# Patient Record
Sex: Male | Born: 1960 | Race: Black or African American | Hispanic: No | Marital: Single | Smoking: Never smoker
Health system: Southern US, Community
[De-identification: ages and names within clinical notes are randomized; demographics above are authoritative.]

## PROBLEM LIST (undated history)

## (undated) DIAGNOSIS — I10 Essential (primary) hypertension: Secondary | ICD-10-CM

## (undated) DIAGNOSIS — E119 Type 2 diabetes mellitus without complications: Secondary | ICD-10-CM

## (undated) HISTORY — PX: TESTICLE TORSION REDUCTION: SHX795

## (undated) NOTE — *Deleted (*Deleted)
[facility] - Clinic Note  05/03/2020     CHIEF COMPLAINT Patient presents for No chief complaint on file.   HISTORY OF PRESENT ILLNESS: Joel Mcdonald is a 66 y.o. male who presents to the clinic today for:     Referring physician: Ileana Ladd, MD [address] [place],  [place] [address]  HISTORICAL INFORMATION:   Selected notes from the MEDICAL RECORD NUMBER Referred by Dr. Nedra Hai:  Ocular Hx- PMH-    CURRENT MEDICATIONS: No current outpatient medications on file. (Ophthalmic Drugs)   No current facility-administered medications for this visit. (Ophthalmic Drugs)   Current Outpatient Medications (Other)  Medication Sig  . atorvastatin (LIPITOR) 40 MG tablet Take 40 mg by mouth every morning.  Marland Kitchen BYDUREON 2 MG PEN   . carvedilol (COREG) 12.5 MG tablet   . dicyclomine (BENTYL) 20 MG tablet Take 1 tablet (20 mg total) by mouth 2 (two) times daily. (Patient not taking: Reported on 09/28/2017)  . Exenatide ER (BYDUREON) 2 MG SRER Inject 2 mg into the skin every Sunday.   Marland Kitchen glimepiride (AMARYL) 4 MG tablet Take 1 tablet (4 mg total) by mouth 2 (two) times daily.  . insulin glargine (LANTUS) 100 UNIT/ML injection Inject 0.2 mLs (20 Units total) into the skin at bedtime.  . insulin lispro (HUMALOG) 100 UNIT/ML injection Inject into the skin as needed. <150 - no insulin 151-200 - Take 6 units 201-250 - Take 8 untis 251-300 - Take 10 units 301-350 - Take 12 units 351-400 - Take 14 units >400 - Take 16 units  . losartan-hydrochlorothiazide (HYZAAR) 100-25 MG tablet   . metFORMIN (GLUCOPHAGE) 1000 MG tablet Take 0.5 tablets (500 mg total) by mouth daily with breakfast.  . ondansetron (ZOFRAN) 4 MG tablet Take 1 tablet (4 mg total) by mouth every 8 (eight) hours as needed for nausea or vomiting. (Patient not taking: Reported on 09/28/2017)  . ONE TOUCH ULTRA TEST test strip    No current facility-administered medications for this visit. (Other)       REVIEW OF SYSTEMS:    ALLERGIES No Known Allergies  PAST MEDICAL HISTORY Past Medical History:  Diagnosis Date  . Diabetes mellitus without complication (HCC)   . Hypertension    Past Surgical History:  Procedure Laterality Date  . TESTICLE TORSION REDUCTION      FAMILY HISTORY Family History  Problem Relation Age of Onset  . Stroke Mother   . Hypertension Father   . Diabetes Mellitus II Sister   . Heart attack Neg Hx     SOCIAL HISTORY Social History   Tobacco Use  . Smoking status: Never Smoker  . Smokeless tobacco: Never Used  Substance Use Topics  . Alcohol use: No  . Drug use: No         OPHTHALMIC EXAM: Not recorded     IMAGING AND PROCEDURES  Imaging and Procedures for 05/03/2020           ASSESSMENT/PLAN:    ICD-10-CM   1. Retinal edema  H35.81 OCT, Retina - OU - Both Eyes    1.  2.  3.  Ophthalmic Meds Ordered this visit:  No orders of the defined types were placed in this encounter.      No follow-ups on file.  There are no Patient Instructions on file for this visit.   Explained the diagnoses, plan, and follow up with the patient and they expressed understanding.  Patient expressed understanding of the  importance of proper follow up care.   This document serves as a record of services personally performed by Karie Chimera, MD, PhD. It was created on their behalf by Glee Arvin. Manson Passey, OA an ophthalmic technician. The creation of this record is the provider's dictation and/or activities during the visit.    Electronically signed by: Glee Arvin. [place]ks, [place] 10.13.2021 12:22 PM   Karie Chimera, M.D., Ph.D. Diseases & Surgery of the Retina and Vitreous [facility]     Abbreviations: M myopia (nearsighted); A astigmatism; H hyperopia (farsighted); P presbyopia; Mrx spectacle prescription;  CTL contact lenses; OD right eye; OS left eye; OU both eyes  XT exotropia; ET esotropia; PEK punctate  epithelial keratitis; PEE punctate epithelial erosions; DES dry eye syndrome; MGD meibomian gland dysfunction; ATs artificial tears; PFAT's preservative free artificial tears; NSC nuclear sclerotic cataract; PSC posterior subcapsular cataract; ERM epi-retinal membrane; PVD posterior vitreous detachment; RD retinal detachment; DM diabetes mellitus; DR diabetic retinopathy; NPDR non-proliferative diabetic retinopathy; PDR proliferative diabetic retinopathy; CSME clinically significant macular edema; DME diabetic macular edema; dbh dot blot hemorrhages; CWS cotton wool spot; POAG primary open angle glaucoma; C/D cup-to-disc ratio; HVF humphrey visual field; GVF goldmann visual field; OCT optical coherence tomography; IOP intraocular pressure; BRVO Branch retinal vein occlusion; CRVO central retinal vein occlusion; CRAO central retinal artery occlusion; BRAO branch retinal artery occlusion; RT retinal tear; SB scleral buckle; PPV pars plana vitrectomy; VH Vitreous hemorrhage; PRP panretinal laser photocoagulation; IVK intravitreal kenalog; VMT vitreomacular traction; MH Macular hole;  NVD neovascularization of the disc; NVE neovascularization elsewhere; AREDS age related eye disease study; ARMD age related macular degeneration; POAG primary open angle glaucoma; EBMD epithelial/anterior basement membrane dystrophy; ACIOL anterior chamber intraocular lens; IOL intraocular lens; PCIOL posterior chamber intraocular lens; Phaco/IOL phacoemulsification with intraocular lens placement; PRK photorefractive keratectomy; LASIK laser assisted in situ keratomileusis; HTN hypertension; DM diabetes mellitus; COPD chronic obstructive pulmonary disease

---

## 2012-09-02 ENCOUNTER — Encounter (HOSPITAL_COMMUNITY): Payer: Self-pay | Admitting: *Deleted

## 2012-09-02 ENCOUNTER — Emergency Department (HOSPITAL_COMMUNITY): Payer: PRIVATE HEALTH INSURANCE

## 2012-09-02 ENCOUNTER — Emergency Department (HOSPITAL_COMMUNITY)
Admission: EM | Admit: 2012-09-02 | Discharge: 2012-09-02 | Disposition: A | Discharge status: Home / Self Care | Payer: PRIVATE HEALTH INSURANCE | Attending: Emergency Medicine | Admitting: Emergency Medicine

## 2012-09-02 DIAGNOSIS — W010XXA Fall on same level from slipping, tripping and stumbling without subsequent striking against object, initial encounter: Secondary | ICD-10-CM | POA: Insufficient documentation

## 2012-09-02 DIAGNOSIS — E119 Type 2 diabetes mellitus without complications: Secondary | ICD-10-CM | POA: Insufficient documentation

## 2012-09-02 DIAGNOSIS — I10 Essential (primary) hypertension: Secondary | ICD-10-CM | POA: Insufficient documentation

## 2012-09-02 DIAGNOSIS — Y92009 Unspecified place in unspecified non-institutional (private) residence as the place of occurrence of the external cause: Secondary | ICD-10-CM | POA: Insufficient documentation

## 2012-09-02 DIAGNOSIS — S42309A Unspecified fracture of shaft of humerus, unspecified arm, initial encounter for closed fracture: Secondary | ICD-10-CM | POA: Insufficient documentation

## 2012-09-02 DIAGNOSIS — Y9301 Activity, walking, marching and hiking: Secondary | ICD-10-CM | POA: Insufficient documentation

## 2012-09-02 DIAGNOSIS — S42302A Unspecified fracture of shaft of humerus, left arm, initial encounter for closed fracture: Secondary | ICD-10-CM

## 2012-09-02 HISTORY — DX: Type 2 diabetes mellitus without complications: E11.9

## 2012-09-02 HISTORY — DX: Essential (primary) hypertension: I10

## 2012-09-02 MED ORDER — FENTANYL CITRATE 0.05 MG/ML IJ SOLN
50.0000 ug | Freq: Once | INTRAMUSCULAR | Status: AC
  Administered 2012-09-02: 50 ug via INTRAVENOUS
  Filled 2012-09-02: qty 2

## 2012-09-02 MED ORDER — OXYCODONE-ACETAMINOPHEN 5-325 MG PO TABS: 1.0000 | ORAL_TABLET | ORAL | Status: DC | PRN

## 2012-09-02 NOTE — ED Notes (Signed)
Pt states was walking to car to go to work and fell on L arm/shoulder, complaining of L arm pain, deformity noted. Able to feel L hand/fingers.

## 2012-09-02 NOTE — ED Notes (Signed)
Per ems pt was walking outside to get in car to go to work and fell, L arm went under pt, obvious deformity to L arm. R hand IV 20g, 150 mcg Fentanyl given by EMS. BP 153/95, HR 93, 99% RA, RR 16. Pain 3/10.

## 2012-09-02 NOTE — ED Notes (Signed)
Patient transported to X-ray 

## 2012-09-02 NOTE — ED Provider Notes (Signed)
History  This chart was scribed for Joel Racer, MD by Bennett Scrape, ED Scribe. This patient was seen in room WHALF/WHALF and the patient's care was started at 9:13 AM.  CSN: [id number]  Arrival date & time 09/02/12  0904   First MD Initiated Contact with Patient 09/02/12 0913      Chief Complaint  Patient presents with  . Arm Injury    left    Patient is a 52 y.o. male presenting with arm injury. The history is provided by the patient. No language interpreter was used.  Arm Injury Location:  Shoulder Injury: yes   Mechanism of injury: fall   Fall:    Fall occurred:  Standing   Entrapped after fall: no   Shoulder location:  L shoulder Pain details:    Onset quality:  Sudden   Timing:  Constant   Progression:  Unchanged Chronicity:  New Associated symptoms: decreased range of motion   Associated symptoms: no back pain and no neck pain     Melville Engen is a 52 y.o. male brought in by ambulance, who presents to the Emergency Department complaining of sudden onset, non-changing, constant left shoulder pain after a slip and fall in his driveway while walking out to go to work. He states that he landed with his left arm under him and has had decreased ROM in the left extremity since the incident. He denies head or neck trauma. He has a h/o HTN and DM and denies smoking and alcohol use.  Past Medical History  Diagnosis Date  . Hypertension   . Diabetes mellitus without complication     History reviewed. No pertinent past surgical history.  History reviewed. No pertinent family history.  History  Substance Use Topics  . Smoking status: Never Smoker   . Smokeless tobacco: Never Used  . Alcohol Use: No      Review of Systems  HENT: Negative for neck pain.   Musculoskeletal: Negative for back pain.       Positive for left shoulder pain  Skin: Negative for wound.  Neurological: Negative for headaches.  All other systems reviewed and are  negative.    Allergies  Review of patient's allergies indicates not on file.  Home Medications  No current outpatient prescriptions on file.  Triage Vitals: BP 150/96  Pulse 97  Temp(Src) 98.5 F (36.9 C) (Oral)  Resp 16  SpO2 95%  Physical Exam  Nursing note and vitals reviewed. Constitutional: He is oriented to person, place, and time. He appears well-developed and well-nourished. No distress.  HENT:  Head: Normocephalic and atraumatic.  No obvious head trauma  Eyes: Conjunctivae and EOM are normal.  Neck: Neck supple. No tracheal deviation present.  No obvious neck trauma  Cardiovascular: Normal rate and regular rhythm.   No murmur heard. Pulmonary/Chest: Effort normal and breath sounds normal. No respiratory distress.  Musculoskeletal:  Edema and tenderness to the left humeral head, neurovascularly intact distally, no skin tenting  Neurological: He is alert and oriented to person, place, and time.  Skin: Skin is warm and dry.  Psychiatric: He has a normal mood and affect. His behavior is normal.    ED Course  Procedures (including critical care time)  DIAGNOSTIC STUDIES: Oxygen Saturation is 95% on room air, adequate by my interpretation.    COORDINATION OF CARE: 9:21 AM-Discussed treatment plan which includes XR of left arm with pt at bedside and pt agreed to plan.   9:30 AM- Ordered 50 mcg Sublimaze injection  10:20 AM-Consult complete with Dr. Jerl Santos, orthopedist. Patient case explained and discussed. Dr. Jerl Santos recommends a long arm splint and will f/u with the pt in office in 2 days. Call ended at 10:21 AM.  10:25 AM- Pt informed of radiology results and plan to discharge with f/u instructions with ortho. Pt is agreeable to this plan.  Labs Reviewed - No data to display Dg Humerus Left  09/02/2012  *RADIOLOGY REPORT*  Clinical Data: Fall, pain.  LEFT HUMERUS - 2+ VIEW  Comparison: None.  Findings: The patient has a transverse fracture through the mid  diaphysis of the left humerus.  The fracture demonstrates one shaft width medial displacement and medial angulation of approximately 30 degrees.  No other acute bony abnormality is identified.  IMPRESSION: Transverse fracture mid diaphysis left humerus as above.   Original Report Authenticated By: Holley Dexter, M.D.      1. Fracture of shaft of left humerus       MDM  I personally performed the services described in this documentation, which was scribed in my presence. The recorded information has been reviewed and is accurate.    Joel Racer, MD 09/02/12 [phone]

## 2012-11-02 ENCOUNTER — Ambulatory Visit
Admission: RE | Admit: 2012-11-02 | Discharge: 2012-11-02 | Disposition: A | Discharge status: Home / Self Care | Payer: PRIVATE HEALTH INSURANCE | Source: Ambulatory Visit | Attending: Physical Medicine and Rehabilitation | Admitting: Physical Medicine and Rehabilitation

## 2012-11-02 ENCOUNTER — Other Ambulatory Visit: Payer: Self-pay | Admitting: Physical Medicine and Rehabilitation

## 2012-11-02 DIAGNOSIS — R609 Edema, unspecified: Secondary | ICD-10-CM

## 2014-10-09 ENCOUNTER — Encounter (HOSPITAL_COMMUNITY): Payer: Self-pay

## 2014-10-09 ENCOUNTER — Inpatient Hospital Stay (HOSPITAL_COMMUNITY): Payer: Commercial Managed Care - PPO

## 2014-10-09 ENCOUNTER — Inpatient Hospital Stay (HOSPITAL_COMMUNITY)
Admission: EM | Admit: 2014-10-09 | Discharge: 2014-10-10 | DRG: 392 | Disposition: A | Discharge status: Home / Self Care | Payer: Commercial Managed Care - PPO | Attending: Internal Medicine | Admitting: Internal Medicine

## 2014-10-09 ENCOUNTER — Emergency Department (HOSPITAL_COMMUNITY): Payer: Commercial Managed Care - PPO

## 2014-10-09 ENCOUNTER — Other Ambulatory Visit (HOSPITAL_COMMUNITY): Payer: Self-pay

## 2014-10-09 DIAGNOSIS — R066 Hiccough: Secondary | ICD-10-CM | POA: Diagnosis present

## 2014-10-09 DIAGNOSIS — E669 Obesity, unspecified: Secondary | ICD-10-CM | POA: Diagnosis present

## 2014-10-09 DIAGNOSIS — E119 Type 2 diabetes mellitus without complications: Secondary | ICD-10-CM | POA: Diagnosis present

## 2014-10-09 DIAGNOSIS — R079 Chest pain, unspecified: Secondary | ICD-10-CM | POA: Diagnosis present

## 2014-10-09 DIAGNOSIS — Z794 Long term (current) use of insulin: Secondary | ICD-10-CM

## 2014-10-09 DIAGNOSIS — E1122 Type 2 diabetes mellitus with diabetic chronic kidney disease: Secondary | ICD-10-CM

## 2014-10-09 DIAGNOSIS — R109 Unspecified abdominal pain: Secondary | ICD-10-CM

## 2014-10-09 DIAGNOSIS — I10 Essential (primary) hypertension: Secondary | ICD-10-CM | POA: Diagnosis present

## 2014-10-09 DIAGNOSIS — N179 Acute kidney failure, unspecified: Secondary | ICD-10-CM | POA: Diagnosis not present

## 2014-10-09 DIAGNOSIS — E1165 Type 2 diabetes mellitus with hyperglycemia: Secondary | ICD-10-CM

## 2014-10-09 DIAGNOSIS — R000 Tachycardia, unspecified: Secondary | ICD-10-CM | POA: Diagnosis present

## 2014-10-09 DIAGNOSIS — R112 Nausea with vomiting, unspecified: Principal | ICD-10-CM | POA: Diagnosis present

## 2014-10-09 DIAGNOSIS — N183 Chronic kidney disease, stage 3 (moderate): Secondary | ICD-10-CM

## 2014-10-09 DIAGNOSIS — Z6832 Body mass index (BMI) 32.0-32.9, adult: Secondary | ICD-10-CM | POA: Diagnosis not present

## 2014-10-09 DIAGNOSIS — IMO0002 Reserved for concepts with insufficient information to code with codable children: Secondary | ICD-10-CM

## 2014-10-09 DIAGNOSIS — Z79899 Other long term (current) drug therapy: Secondary | ICD-10-CM | POA: Diagnosis not present

## 2014-10-09 LAB — BASIC METABOLIC PANEL
Anion gap: 11 (ref 5–15)
BUN: 22 mg/dL (ref 6–23)
CHLORIDE: 97 mmol/L (ref 96–112)
CO2: 27 mmol/L (ref 19–32)
Calcium: 9.7 mg/dL (ref 8.4–10.5)
Creatinine, Ser: 1.74 mg/dL — ABNORMAL HIGH (ref 0.50–1.35)
GFR calc Af Amer: 50 mL/min — ABNORMAL LOW (ref >90)
GFR calc non Af Amer: 43 mL/min — ABNORMAL LOW (ref >90)
GLUCOSE: 145 mg/dL — AB (ref 70–99)
POTASSIUM: 4.3 mmol/L (ref 3.5–5.1)
SODIUM: 135 mmol/L (ref 135–145)

## 2014-10-09 LAB — CBC
HCT: 41 % (ref 39.0–52.0)
HEMOGLOBIN: 14 g/dL (ref 13.0–17.0)
MCH: 28.1 pg (ref 26.0–34.0)
MCHC: 34.1 g/dL (ref 30.0–36.0)
MCV: 82.2 fL (ref 78.0–100.0)
Platelets: 272 10*3/uL (ref 150–400)
RBC: 4.99 MIL/uL (ref 4.22–5.81)
RDW: 13.3 % (ref 11.5–15.5)
WBC: 12.2 10*3/uL — ABNORMAL HIGH (ref 4.0–10.5)

## 2014-10-09 LAB — TSH: TSH: 1.21 u[IU]/mL (ref 0.350–4.500)

## 2014-10-09 LAB — TROPONIN I

## 2014-10-09 LAB — D-DIMER, QUANTITATIVE: D-Dimer, Quant: 0.41 ug/mL-FEU (ref 0.00–0.48)

## 2014-10-09 LAB — GLUCOSE, CAPILLARY: GLUCOSE-CAPILLARY: 91 mg/dL (ref 70–99)

## 2014-10-09 LAB — I-STAT TROPONIN, ED: TROPONIN I, POC: 0 ng/mL (ref 0.00–0.08)

## 2014-10-09 MED ORDER — INSULIN GLARGINE 100 UNIT/ML ~~LOC~~ SOLN
40.0000 [IU] | Freq: Every morning | SUBCUTANEOUS | Status: DC
  Filled 2014-10-09: qty 0.4

## 2014-10-09 MED ORDER — ASPIRIN 81 MG PO CHEW
324.0000 mg | CHEWABLE_TABLET | Freq: Once | ORAL | Status: AC
  Administered 2014-10-09: 324 mg via ORAL
  Filled 2014-10-09: qty 4

## 2014-10-09 MED ORDER — ACETAMINOPHEN 325 MG PO TABS: 650.0000 mg | ORAL_TABLET | Freq: Four times a day (QID) | ORAL | Status: DC | PRN

## 2014-10-09 MED ORDER — ACETAMINOPHEN 650 MG RE SUPP: 650.0000 mg | Freq: Four times a day (QID) | RECTAL | Status: DC | PRN

## 2014-10-09 MED ORDER — PANTOPRAZOLE SODIUM 40 MG IV SOLR
40.0000 mg | Freq: Two times a day (BID) | INTRAVENOUS | Status: DC
  Administered 2014-10-09 – 2014-10-10 (×2): 40 mg via INTRAVENOUS
  Filled 2014-10-09 (×3): qty 40

## 2014-10-09 MED ORDER — LABETALOL HCL 5 MG/ML IV SOLN
5.0000 mg | INTRAVENOUS | Status: DC | PRN
  Filled 2014-10-09: qty 4

## 2014-10-09 MED ORDER — ONDANSETRON HCL 4 MG PO TABS: 4.0000 mg | ORAL_TABLET | Freq: Four times a day (QID) | ORAL | Status: DC | PRN

## 2014-10-09 MED ORDER — ONDANSETRON HCL 4 MG/2ML IJ SOLN: 4.0000 mg | Freq: Four times a day (QID) | INTRAMUSCULAR | Status: DC | PRN

## 2014-10-09 MED ORDER — INSULIN ASPART 100 UNIT/ML ~~LOC~~ SOLN
0.0000 [IU] | Freq: Three times a day (TID) | SUBCUTANEOUS | Status: DC
  Administered 2014-10-10: 1 [IU] via SUBCUTANEOUS
  Administered 2014-10-10: 2 [IU] via SUBCUTANEOUS

## 2014-10-09 MED ORDER — SODIUM CHLORIDE 0.9 % IV SOLN
INTRAVENOUS | Status: DC
  Administered 2014-10-09: 23:00:00 via INTRAVENOUS

## 2014-10-09 MED ORDER — SODIUM CHLORIDE 0.9 % IV BOLUS (SEPSIS)
1000.0000 mL | Freq: Once | INTRAVENOUS | Status: AC
Start: 2014-10-09 — End: 2014-10-09
  Administered 2014-10-09: 1000 mL via INTRAVENOUS

## 2014-10-09 MED ORDER — GLIMEPIRIDE 4 MG PO TABS
4.0000 mg | ORAL_TABLET | Freq: Two times a day (BID) | ORAL | Status: DC
  Administered 2014-10-09 – 2014-10-10 (×2): 4 mg via ORAL
  Filled 2014-10-09 (×3): qty 1

## 2014-10-09 NOTE — ED Notes (Signed)
Pt returned from XR. Bed assignment changed to 5W. Pt made aware

## 2014-10-09 NOTE — ED Notes (Signed)
Pt threw up at work today and went to MD cause of that. Had hiccups and they wouldn't go away. When he got to the MD office, HR was up and BP was up. Pt was sent here. Denies any pain at present. Was having indigestion earlier.

## 2014-10-09 NOTE — H&P (Signed)
[facility] History and Physical  Joel KluverRicky Mcdonald UJW:[id number] DOB: 08/20/1960 DOA: 10/09/2014  Referring physician: ER physician. PCP: Redmond BasemanWONG,Joel PATRICK, MD  Chief Complaint: Nausea vomiting and chest pain.  HPI: Joel KluverRicky Mcdonald is a 54 y.o. male with history of diabetes mellitus2, hypertension presents to the ER because of nausea vomiting and chest pain. Patient states he has been having hiccups over the last 2 months. Today patient's hiccups got worsened with patient having 2 episodes of nausea vomiting. Patient also has benign some burning chest pain over the last couple of days. Denies any fever chills shortness of breath productive cough. Patient initially had some epigastric discomfort which has resolved at this time. In the ER patient was found to be tachycardic. Cardiac markers were negative EKG was sinus sinus tachycardia. D-dimer was negative. Since patient is still tachycardic and nauseous patient is being admitted for further management. Patient denies having been started on any new medication except for exenatide 2 months ago.   Review of Systems: As presented in the history of presenting illness, rest negative.  Past Medical History  Diagnosis Date  . Hypertension   . Diabetes mellitus without complication    Past Surgical History  Procedure Laterality Date  . Testicle torsion reduction     Social History:  reports that he has never smoked. He has never used smokeless tobacco. He reports that he does not drink alcohol or use illicit drugs. Where does patient live home. Can patient participate in ADLs? Yes.  No Known Allergies  Family History:  Family History  Problem Relation Age of Onset  . Diabetes Mellitus II Sister       Prior to Admission medications   Medication Sig Start Date End Date Taking? Authorizing Provider  Exenatide ER 2 MG PEN Inject 2 mg into the skin every 7 (seven) days. Sunday   Yes Historical Provider, MD  glimepiride (AMARYL) 4 MG tablet  Take 4 mg by mouth 2 (two) times daily.   Yes Historical Provider, MD  insulin glargine (LANTUS) 100 UNIT/ML injection Inject 40 Units into the skin every morning.    Yes Historical Provider, MD  insulin glulisine (APIDRA) 100 UNIT/ML injection Inject 15-20 Units into the skin every evening.   Yes Historical Provider, MD  metFORMIN (GLUCOPHAGE) 1000 MG tablet Take 1,000 mg by mouth 2 (two) times daily with a meal.   Yes Historical Provider, MD  testosterone (ANDROGEL) 50 MG/5GM (1%) GEL Place 5 g onto the skin daily.   Yes Historical Provider, MD  VALSARTAN PO Take 1 tablet by mouth every morning.   Yes Historical Provider, MD    Physical Exam: Filed Vitals:   10/09/14 2017 10/09/14 2045 10/09/14 2130 10/09/14 2145  BP: 148/94 127/94 159/92 162/93  Pulse: 118 110 109 115  Temp: 98 F (36.7 C)     TempSrc: Oral     Resp: 16  17 19   Height:      Weight:      SpO2: 100% 99% 100% 100%     General:  Well-developed and nourished.  Eyes: Anicteric no pallor.  ENT: No discharge from the ears eyes nose or mouth.  Neck: No mass felt.  Cardiovascular: S1-S2 heard tachycardic.  Respiratory: No rhonchi or crepitations.  Abdomen: Soft nontender bowel sounds present.  Skin: No rash.  Musculoskeletal: No edema.  Psychiatric: Appears normal.  Neurologic: Alert awake oriented to time place and person. Moves all extremities.  Labs on Admission:  Basic Metabolic Panel:  Recent Labs Lab 10/09/14  1630  NA 135  K 4.3  CL 97  CO2 27  GLUCOSE 145*  BUN 22  CREATININE 1.74*  CALCIUM 9.7   Liver Function Tests: No results for input(s): AST, ALT, ALKPHOS, BILITOT, PROT, ALBUMIN in the last 168 hours. No results for input(s): LIPASE, AMYLASE in the last 168 hours. No results for input(s): AMMONIA in the last 168 hours. CBC:  Recent Labs Lab 10/09/14 1630  WBC 12.2*  HGB 14.0  HCT 41.0  MCV 82.2  PLT 272   Cardiac Enzymes:  Recent Labs Lab 10/09/14 2038  TROPONINI  <0.03    BNP (last 3 results) No results for input(s): BNP in the last 8760 hours.  ProBNP (last 3 results) No results for input(s): PROBNP in the last 8760 hours.  CBG: No results for input(s): GLUCAP in the last 168 hours.  Radiological Exams on Admission: Dg Chest 2 View  10/09/2014   CLINICAL DATA:  Acute onset of cough and sore throat for more than 1 week. Nausea, vomiting, diarrhea and fever. Initial encounter.  EXAM: CHEST  2 VIEW  COMPARISON:  None.  FINDINGS: The lungs are well-aerated and clear. There is no evidence of focal opacification, pleural effusion or pneumothorax.  The heart is normal in size; the mediastinal contour is within normal limits. No acute osseous abnormalities are seen. A metallic density overlying the left side of the neck appears to be outside the patient.  IMPRESSION: No acute cardiopulmonary process seen; no displaced rib fractures identified.   Electronically Signed   By: Roanna Raider M.D.   On: 10/09/2014 19:10    EKG: Independently reviewed. Sinus tachycardia.  Assessment/Plan Principal Problem:   Nausea & vomiting Active Problems:   Chest pain   Hiccoughs   Diabetes mellitus type 2, controlled   Sinus tachycardia   Hypertension   ARF (acute renal failure)   1. Nausea vomiting with chest pain with hiccups - at this time I have ordered KUB and LFTs along with lipase. I have placed patient on IV Protonix and clear liquid diet. We will cycle cardiac markers check 2-D echo. Patient abdomen appears benign. And if symptoms does not get better we will get CT of the chest and abdomen. 2. Sinus tachycardia - check thyroid function test and a urine drug screen. Continue with hydration. Patient does not look septic at this time. 3. Diabetes mellitus type 2 controlled - will hold off exenatide for now. Continue Lantus and Amaryl. Hold metformin due to renal failure. 4. Renal failure probably acute - closely follow intake output and metabolic panel.  Continue hydration. 5. Hypertension - when necessary IV labetalol. Hold losartan due to renal failure for now.   DVT Prophylaxis Lovenox.  Code Status: Full code.  Family Communication: None.  Disposition Plan: Admit to inpatient.    KAKRAKANDY,ARSHAD N. [facility] Pager [phone].  If 7PM-7AM, please contact night-coverage [email or web address] Password [email or web address] 10/09/2014, 10:09 PM

## 2014-10-09 NOTE — ED Notes (Signed)
MD at bedside. 

## 2014-10-09 NOTE — Progress Notes (Signed)
2250 pt received to 5w rm 09 via stretcher, a/o x4, denies pain, level 0/10.

## 2014-10-09 NOTE — ED Provider Notes (Signed)
Angiocath insertion Performed by: Dorna LeitzNickle, Camay Pedigo  Consent: Verbal consent obtained. Risks and benefits: risks, benefits and alternatives were discussed Time out: Immediately prior to procedure a "time out" was called to verify the correct patient, procedure, equipment, support staff and site/side marked as required.  Preparation: Patient was prepped and draped in the usual sterile fashion.  Vein Location: left brachial  Ultrasound Guided  Gauge: 18  Normal blood return and flush without difficulty Patient tolerance: Patient tolerated the procedure well with no immediate complications.    Dorna LeitzAlex Tay Whitwell, MD 10/09/14 2112  Blane OharaJoshua Zavitz, MD 10/10/14 [phone]

## 2014-10-09 NOTE — Progress Notes (Signed)
Received report for RN, awaiting patient's arrival.

## 2014-10-09 NOTE — Progress Notes (Signed)
  Pt admitted to the unit. Pt is stable, alert and oriented per baseline. Oriented to room, staff, and call bell. Educated to call for any assistance. Bed in lowest position, call bell within reach- will continue to monitor. 

## 2014-10-09 NOTE — ED Provider Notes (Signed)
CSN: [id number]     Arrival date & time 10/09/14  1754 History   First MD Initiated Contact with Patient 10/09/14 2003     Chief Complaint  Patient presents with  . Hypertension  . Tachycardia     (Consider location/radiation/quality/duration/timing/severity/associated sxs/prior Treatment) HPI Comments: 54 year old male with history of diabetes, high blood pressure, obesity, nonsmoker presents for recurrent reflux symptoms and hiccups worsening for the past 2 weeks. Patient does say worse after eating spicy foods. Patient seen primary doctors today and sent over for further workup for tachycardia and symptoms. Patient denies a blood clot history, recent surgeries, shortness of breath, active cancer, cardiac history, family history of cardiac. Patient is taking valsartan regularly compliant. No weight gain or thyroid history. Symptoms intermittent.  Patient is a 54 y.o. male presenting with hypertension. The history is provided by the patient.  Hypertension Associated symptoms include chest pain (burning, nonradiating, intermittent anterior chest with food). Pertinent negatives include no abdominal pain, no headaches and no shortness of breath.    Past Medical History  Diagnosis Date  . Hypertension   . Diabetes mellitus without complication    Past Surgical History  Procedure Laterality Date  . Testicle torsion reduction     Family History  Problem Relation Age of Onset  . Diabetes Mellitus II Sister    History  Substance Use Topics  . Smoking status: Never Smoker   . Smokeless tobacco: Never Used  . Alcohol Use: No    Review of Systems  Constitutional: Positive for appetite change. Negative for fever and chills.  HENT: Negative for congestion.   Eyes: Negative for visual disturbance.  Respiratory: Negative for shortness of breath.   Cardiovascular: Positive for chest pain (burning, nonradiating, intermittent anterior chest with food).  Gastrointestinal: Negative for  vomiting and abdominal pain.  Genitourinary: Negative for dysuria and flank pain.  Musculoskeletal: Negative for back pain, neck pain and neck stiffness.  Skin: Negative for rash.  Neurological: Negative for light-headedness and headaches.      Allergies  Review of patient's allergies indicates no known allergies.  Home Medications   Prior to Admission medications   Medication Sig Start Date End Date Taking? Authorizing Provider  Exenatide ER 2 MG PEN Inject 2 mg into the skin every 7 (seven) days. Sunday   Yes Historical Provider, MD  glimepiride (AMARYL) 4 MG tablet Take 4 mg by mouth 2 (two) times daily.   Yes Historical Provider, MD  insulin glargine (LANTUS) 100 UNIT/ML injection Inject 40 Units into the skin every morning.    Yes Historical Provider, MD  insulin glulisine (APIDRA) 100 UNIT/ML injection Inject 15-20 Units into the skin every evening.   Yes Historical Provider, MD  metFORMIN (GLUCOPHAGE) 1000 MG tablet Take 1,000 mg by mouth 2 (two) times daily with a meal.   Yes Historical Provider, MD  testosterone (ANDROGEL) 50 MG/5GM (1%) GEL Place 5 g onto the skin daily.   Yes Historical Provider, MD  VALSARTAN PO Take 1 tablet by mouth every morning.   Yes Historical Provider, MD   BP 162/93 mmHg  Pulse 115  Temp(Src) 98 F (36.7 C) (Oral)  Resp 19  Ht  (1.854 m)  Wt 246 lb (111.585 kg)  BMI 32.46 kg/m2  SpO2 100% Physical Exam  Constitutional: He is oriented to person, place, and time. He appears well-developed and well-nourished.  HENT:  Head: Normocephalic and atraumatic.  Eyes: Conjunctivae are normal. Right eye exhibits no discharge. Left eye exhibits no discharge.  Neck: Normal range of motion. Neck supple. No tracheal deviation present.  Cardiovascular: Regular rhythm.  Tachycardia present.   Pulmonary/Chest: Effort normal and breath sounds normal.  Abdominal: Soft. He exhibits no distension. There is no tenderness. There is no guarding.   Musculoskeletal: He exhibits no edema.  Neurological: He is alert and oriented to person, place, and time.  Skin: Skin is warm. No rash noted.  Psychiatric: He has a normal mood and affect.  Nursing note and vitals reviewed.   ED Course  Procedures (including critical care time) Labs Review Labs Reviewed  CBC - Abnormal; Notable for the following:    WBC 12.2 (*)    All other components within normal limits  BASIC METABOLIC PANEL - Abnormal; Notable for the following:    Glucose, Bld 145 (*)    Creatinine, Ser 1.74 (*)    GFR calc non Af Amer 43 (*)    GFR calc Af Amer 50 (*)    All other components within normal limits  D-DIMER, QUANTITATIVE  TROPONIN I  TSH  URINALYSIS, ROUTINE W REFLEX MICROSCOPIC  URINE RAPID DRUG SCREEN (HOSP PERFORMED)  HEPATIC FUNCTION PANEL  LIPASE, BLOOD  CBC WITH DIFFERENTIAL/PLATELET  T4, FREE  T3, FREE  I-STAT TROPOININ, ED  TYPE AND SCREEN    Imaging Review Dg Chest 2 View  10/09/2014   CLINICAL DATA:  Acute onset of cough and sore throat for more than 1 week. Nausea, vomiting, diarrhea and fever. Initial encounter.  EXAM: CHEST  2 VIEW  COMPARISON:  None.  FINDINGS: The lungs are well-aerated and clear. There is no evidence of focal opacification, pleural effusion or pneumothorax.  The heart is normal in size; the mediastinal contour is within normal limits. No acute osseous abnormalities are seen. A metallic density overlying the left side of the neck appears to be outside the patient.  IMPRESSION: No acute cardiopulmonary process seen; no displaced rib fractures identified.   Electronically Signed   By: Roanna RaiderJeffery  Chang M.D.   On: 10/09/2014 19:10   Dg Abd 1 View  10/09/2014   CLINICAL DATA:  Vomiting.  Abdominal pain.  Duration:  1 day  EXAM: ABDOMEN - 1 VIEW  COMPARISON:  08/03/2013  FINDINGS: Upper normal amount of stool in the colon. The small bowel is primarily gasless. No significant abnormal calcifications observed.  IMPRESSION: 1. The  small bowel is relatively gasless, which can reduced negative predictive value of conventional radiography in assessing the small bowel. Upper normal amount of stool in the colon.   Electronically Signed   By: Gaylyn RongWalter  Liebkemann M.D.   On: 10/09/2014 22:25     EKG Interpretation   Date/Time:  Tuesday October 09 2014 18:22:06 EDT Ventricular Rate:  127 PR Interval:  154 QRS Duration: 84 QT Interval:  312 QTC Calculation: 453 R Axis:   -27 Text Interpretation:  Sinus tachycardia Abnormal ECG Confirmed by Nazar Kuan   MD, Deddrick Saindon ([id number]) on 10/09/2014 8:06:23 PM Also confirmed by Jodi MourningZAVITZ  MD,  Robecca Fulgham ([id number])  on 10/09/2014 9:27:45 PM      MDM   Final diagnoses:  Chest pain, unspecified chest pain type  Non-intractable vomiting with nausea, vomiting of unspecified type  Acute renal failure, unspecified acute renal failure type  Hiccoughs  Chest pain Sinus tachycardia ARF  Well-appearing patient with no significant symptoms currently presents for intermittent reflux symptoms and tachycardia. Patient has no blood clot risk factors however with persistent tachycardia d-dimer ordered as patient low risk. Plan for delta troponin, patient  has 3 cardiac risk factors. Thyroid test sent.  Heart score 3  The patients results and plan were reviewed and discussed.   Any x-rays performed were personally reviewed by myself.   Differential diagnosis were considered with the presenting HPI.  Medications  sodium chloride 0.9 % bolus 1,000 mL (0 mLs Intravenous Stopped 10/09/14 2159)  aspirin chewable tablet 324 mg (324 mg Oral Given 10/09/14 2142)    Filed Vitals:   10/09/14 2017 10/09/14 2045 10/09/14 2130 10/09/14 2145  BP: 148/94 127/94 159/92 162/93  Pulse: 118 110 109 115  Temp: 98 F (36.7 C)     TempSrc: Oral     Resp: Height:      Weight:      SpO2: 100% 99% 100% 100%    Final diagnoses:  Chest pain, unspecified chest pain type  Non-intractable vomiting with nausea,  vomiting of unspecified type  Acute renal failure, unspecified acute renal failure type  Hiccoughs    Admission/ observation were discussed with the admitting physician, patient and/or family and they are comfortable with the plan.    Blane Ohara, MD 10/09/14 2236

## 2014-10-10 DIAGNOSIS — E119 Type 2 diabetes mellitus without complications: Secondary | ICD-10-CM

## 2014-10-10 LAB — URINE MICROSCOPIC-ADD ON

## 2014-10-10 LAB — CBC WITH DIFFERENTIAL/PLATELET
BASOS PCT: 0 % (ref 0–1)
Basophils Absolute: 0 10*3/uL (ref 0.0–0.1)
Basophils Absolute: 0 10*3/uL (ref 0.0–0.1)
Basophils Relative: 0 % (ref 0–1)
EOS PCT: 1 % (ref 0–5)
Eosinophils Absolute: 0.1 10*3/uL (ref 0.0–0.7)
Eosinophils Absolute: 0.2 10*3/uL (ref 0.0–0.7)
Eosinophils Relative: 2 % (ref 0–5)
HCT: 42.5 % (ref 39.0–52.0)
HCT: 39.3 % (ref 39.0–52.0)
HEMOGLOBIN: 13.1 g/dL (ref 13.0–17.0)
Hemoglobin: 14.3 g/dL (ref 13.0–17.0)
LYMPHS PCT: 25 % (ref 12–46)
Lymphocytes Relative: 26 % (ref 12–46)
Lymphs Abs: 2.5 10*3/uL (ref 0.7–4.0)
Lymphs Abs: 2.7 10*3/uL (ref 0.7–4.0)
MCH: 28.6 pg (ref 26.0–34.0)
MCH: 28.1 pg (ref 26.0–34.0)
MCHC: 33.6 g/dL (ref 30.0–36.0)
MCHC: 33.3 g/dL (ref 30.0–36.0)
MCV: 85 fL (ref 78.0–100.0)
MCV: 84.3 fL (ref 78.0–100.0)
MONOS PCT: 10 % (ref 3–12)
Monocytes Absolute: 0.6 10*3/uL (ref 0.1–1.0)
Monocytes Absolute: 1 10*3/uL (ref 0.1–1.0)
Monocytes Relative: 6 % (ref 3–12)
NEUTROS ABS: 6.3 10*3/uL (ref 1.7–7.7)
NEUTROS PCT: 68 % (ref 43–77)
Neutro Abs: 6.8 10*3/uL (ref 1.7–7.7)
Neutrophils Relative %: 62 % (ref 43–77)
Platelets: 238 10*3/uL (ref 150–400)
Platelets: 225 10*3/uL (ref 150–400)
RBC: 5 MIL/uL (ref 4.22–5.81)
RBC: 4.66 MIL/uL (ref 4.22–5.81)
RDW: 13.5 % (ref 11.5–15.5)
RDW: 13.5 % (ref 11.5–15.5)
WBC: 10 10*3/uL (ref 4.0–10.5)
WBC: 10.1 10*3/uL (ref 4.0–10.5)

## 2014-10-10 LAB — GLUCOSE, CAPILLARY
GLUCOSE-CAPILLARY: 126 mg/dL — AB (ref 70–99)
GLUCOSE-CAPILLARY: 156 mg/dL — AB (ref 70–99)
Glucose-Capillary: 77 mg/dL (ref 70–99)

## 2014-10-10 LAB — TYPE AND SCREEN
ABO/RH(D): B POS
Antibody Screen: NEGATIVE

## 2014-10-10 LAB — HEPATIC FUNCTION PANEL
ALT: 22 U/L (ref 0–53)
AST: 26 U/L (ref 0–37)
Albumin: 3.5 g/dL (ref 3.5–5.2)
Alkaline Phosphatase: 71 U/L (ref 39–117)
BILIRUBIN TOTAL: 0.7 mg/dL (ref 0.3–1.2)
Bilirubin, Direct: 0.1 mg/dL (ref 0.0–0.5)
Indirect Bilirubin: 0.6 mg/dL (ref 0.3–0.9)
Total Protein: 7.8 g/dL (ref 6.0–8.3)

## 2014-10-10 LAB — RAPID URINE DRUG SCREEN, HOSP PERFORMED
AMPHETAMINES: NOT DETECTED
Barbiturates: NOT DETECTED
Benzodiazepines: NOT DETECTED
Cocaine: NOT DETECTED
OPIATES: NOT DETECTED
Tetrahydrocannabinol: NOT DETECTED

## 2014-10-10 LAB — COMPREHENSIVE METABOLIC PANEL
ALK PHOS: 60 U/L (ref 39–117)
ALT: 19 U/L (ref 0–53)
AST: 22 U/L (ref 0–37)
Albumin: 3.1 g/dL — ABNORMAL LOW (ref 3.5–5.2)
Anion gap: 4 — ABNORMAL LOW (ref 5–15)
BUN: 22 mg/dL (ref 6–23)
CALCIUM: 8.7 mg/dL (ref 8.4–10.5)
CO2: 29 mmol/L (ref 19–32)
Chloride: 104 mmol/L (ref 96–112)
Creatinine, Ser: 1.64 mg/dL — ABNORMAL HIGH (ref 0.50–1.35)
GFR calc Af Amer: 54 mL/min — ABNORMAL LOW (ref >90)
GFR, EST NON AFRICAN AMERICAN: 46 mL/min — AB (ref >90)
GLUCOSE: 93 mg/dL (ref 70–99)
Potassium: 4.7 mmol/L (ref 3.5–5.1)
SODIUM: 137 mmol/L (ref 135–145)
Total Bilirubin: 0.6 mg/dL (ref 0.3–1.2)
Total Protein: 7.5 g/dL (ref 6.0–8.3)

## 2014-10-10 LAB — URINALYSIS, ROUTINE W REFLEX MICROSCOPIC
Bilirubin Urine: NEGATIVE
Glucose, UA: NEGATIVE mg/dL
Hgb urine dipstick: NEGATIVE
Ketones, ur: NEGATIVE mg/dL
Leukocytes, UA: NEGATIVE
Nitrite: NEGATIVE
Protein, ur: 30 mg/dL — AB
SPECIFIC GRAVITY, URINE: 1.017 (ref 1.005–1.030)
Urobilinogen, UA: 1 mg/dL (ref 0.0–1.0)
pH: 6.5 (ref 5.0–8.0)

## 2014-10-10 LAB — TROPONIN I

## 2014-10-10 LAB — ABO/RH: ABO/RH(D): B POS

## 2014-10-10 LAB — T4, FREE: FREE T4: 1.24 ng/dL (ref 0.80–1.80)

## 2014-10-10 LAB — LIPASE, BLOOD: Lipase: 43 U/L (ref 11–59)

## 2014-10-10 MED ORDER — DEXTROSE-NACL 5-0.45 % IV SOLN
INTRAVENOUS | Status: DC
  Administered 2014-10-10: 09:00:00 via INTRAVENOUS

## 2014-10-10 MED ORDER — PANTOPRAZOLE SODIUM 40 MG PO TBEC: 40.0000 mg | DELAYED_RELEASE_TABLET | Freq: Two times a day (BID) | ORAL | Status: DC

## 2014-10-10 MED ORDER — CARVEDILOL 6.25 MG PO TABS: 6.2500 mg | ORAL_TABLET | Freq: Two times a day (BID) | ORAL | Status: DC

## 2014-10-10 MED ORDER — INSULIN GLARGINE 100 UNIT/ML ~~LOC~~ SOLN
20.0000 [IU] | Freq: Two times a day (BID) | SUBCUTANEOUS | Status: DC
  Filled 2014-10-10: qty 0.2

## 2014-10-10 NOTE — Discharge Instructions (Signed)
Follow with Primary MD Redmond BasemanWONG,FRANCIS PATRICK, MD in 7 days   Get CBC, CMP, 2 view Chest X ray checked  by Primary MD next visit.    Activity: As tolerated with Full fall precautions use walker/cane & assistance as needed   Disposition Home     Diet: Heart Healthy    For Heart failure patients - Check your Weight same time everyday, if you gain over 2 pounds, or you develop in leg swelling, experience more shortness of breath or chest pain, call your Primary MD immediately. Follow Cardiac Low Salt Diet and 1.5 lit/day fluid restriction.   On your next visit with your primary care physician please Get Medicines reviewed and adjusted.   Please request your Prim.MD to go over all Hospital Tests and Procedure/Radiological results at the follow up, please get all Hospital records sent to your Prim MD by signing hospital release before you go home.   If you experience worsening of your admission symptoms, develop shortness of breath, life threatening emergency, suicidal or homicidal thoughts you must seek medical attention immediately by calling 911 or calling your MD immediately  if symptoms less severe.  You Must read complete instructions/literature along with all the possible adverse reactions/side effects for all the Medicines you take and that have been prescribed to you. Take any new Medicines after you have completely understood and accpet all the possible adverse reactions/side effects.   Do not drive, operating heavy machinery, perform activities at heights, swimming or participation in water activities or provide baby sitting services if your were admitted for syncope or siezures until you have seen by Primary MD or a Neurologist and advised to do so again.  Do not drive when taking Pain medications.    Do not take more than prescribed Pain, Sleep and Anxiety Medications  Special Instructions: If you have smoked or chewed Tobacco  in the last 2 yrs please stop smoking, stop any  regular Alcohol  and or any Recreational drug use.  Wear Seat belts while driving.   Please note  You were cared for by a hospitalist during your hospital stay. If you have any questions about your discharge medications or the care you received while you were in the hospital after you are discharged, you can call the unit and asked to speak with the hospitalist on call if the hospitalist that took care of you is not available. Once you are discharged, your primary care physician will handle any further medical issues. Please note that NO REFILLS for any discharge medications will be authorized once you are discharged, as it is imperative that you return to your primary care physician (or establish a relationship with a primary care physician if you do not have one) for your aftercare needs so that they can reassess your need for medications and monitor your lab values.

## 2014-10-10 NOTE — Progress Notes (Signed)
NURSING PROGRESS NOTE  Joel KluverRicky Mcdonald [id number] Discharge Data: 10/10/2014 3:06 PM Attending Provider: Leroy SeaPrashant K Singh, MD BJY:NWGN,FAOZHYQPCP:WONG,FRANCIS Joel HartPATRICK, MD     Joel Kluvericky Mcdonald to be D/C'd Home per MD order.  Discussed with the patient the After Visit Summary and all questions fully answered. All IV's discontinued with no bleeding noted. All belongings returned to patient for patient to take home.   Last Vital Signs:  Blood pressure 126/82, pulse 107, temperature 98.7 F (37.1 C), temperature source Oral, resp. rate 18, height 6\' 1"  (1.854 m), weight 109.952 kg (242 lb 6.4 oz), SpO2 95 %.  Discharge Medication List   Medication List    STOP taking these medications        VALSARTAN PO      TAKE these medications        carvedilol 6.25 MG tablet  Commonly known as:  COREG  Take 1 tablet (6.25 mg total) by mouth 2 (two) times daily with a meal.     Exenatide ER 2 MG Pen  Inject 2 mg into the skin every 7 (seven) days. Sunday     glimepiride 4 MG tablet  Commonly known as:  AMARYL  Take 4 mg by mouth 2 (two) times daily.     insulin glargine 100 UNIT/ML injection  Commonly known as:  LANTUS  Inject 40 Units into the skin every morning.     insulin glulisine 100 UNIT/ML injection  Commonly known as:  APIDRA  Inject 15-20 Units into the skin every evening.     metFORMIN 1000 MG tablet  Commonly known as:  GLUCOPHAGE  Take 1,000 mg by mouth 2 (two) times daily with a meal.     pantoprazole 40 MG tablet  Commonly known as:  PROTONIX  Take 1 tablet (40 mg total) by mouth 2 (two) times daily.     testosterone 50 MG/5GM (1%) Gel  Commonly known as:  ANDROGEL  Place 5 g onto the skin daily.         Joel Parsonsattha Anahid Eskelson, RN

## 2014-10-10 NOTE — Progress Notes (Signed)
Utilization review completed.  

## 2014-10-10 NOTE — Discharge Summary (Signed)
Joel Mcdonald, is a 54 y.o. male  DOB [birth year]  MRN [id number].  Admission date:  10/09/2014  Admitting Physician  Eduard Clos, MD  Discharge Date:  10/10/2014   Primary MD  Redmond Baseman, MD  Recommendations for primary care physician for things to follow:   Must follow with GI in a week. Follow BMP closely   Admission Diagnosis  Hiccoughs [R06.6] Abdominal pain [R10.9] Chest pain, unspecified chest pain type [R07.9] Acute renal failure, unspecified acute renal failure type [N17.9] Non-intractable vomiting with nausea, vomiting of unspecified type [R11.2]   Discharge Diagnosis  Hiccoughs [R06.6] Abdominal pain [R10.9] Chest pain, unspecified chest pain type [R07.9] Acute renal failure, unspecified acute renal failure type [N17.9] Non-intractable vomiting with nausea, vomiting of unspecified type [R11.2]     Principal Problem:   Nausea & vomiting Active Problems:   Chest pain   Hiccoughs   Diabetes mellitus type 2, controlled   Sinus tachycardia   Hypertension   ARF (acute renal failure)      Past Medical History  Diagnosis Date  . Hypertension   . Diabetes mellitus without complication     Past Surgical History  Procedure Laterality Date  . Testicle torsion reduction         History of present illness and  Hospital Course:     Kindly see H&P for history of present illness and admission details, please review complete Labs, Consult reports and Test reports for all details in brief  HPI  from the history and physical done on the day of admission  Joel Mcdonald is a 54 y.o. male with history of diabetes mellitus2, hypertension presents to the ER because of nausea vomiting and chest pain. Patient states he has been having hiccups over the last 2 months. Today patient's hiccups  got worsened with patient having 2 episodes of nausea vomiting. Patient also has benign some burning chest pain over the last couple of days. Denies any fever chills shortness of breath productive cough. Patient initially had some epigastric discomfort which has resolved at this time. In the ER patient was found to be tachycardic. Cardiac markers were negative EKG was sinus sinus tachycardia. D-dimer was negative. Since patient is still tachycardic and nauseous patient is being admitted for further management. Patient denies having been started on any new medication except for exenatide 2 months ago.   Hospital Course    1. Persistent heartburn with ongoing hiccups and GI etiology induced substernal chest pain. Symptoms much better with bowel rest and IV PPI, highly suspicious for esophagitis/gastritis, placed on oral PPI, troponin 2 sets negative, EKG nonacute. We will place on PPI twice a day, requesting to follow with GI in the next 1-2 weeks will likely require EGD in the future.   2. Renal failure. No previous labs in the system could be chronic but not confirmed, ARB discontinued, hydrated gently with some improvement, request PCP to continue monitoring BMP.   3. DM type II. Stable continue home regimen. If renal function continues to decline discontinue  Glucophage.   4. Essential hypertension. Since ARB. We will place on low-dose Coreg.     Discharge Condition: Stable   Follow UP  Follow-up Information    Follow up with Redmond Baseman, MD. Schedule an appointment as soon as possible for a visit in 1 week.   Specialty:  Family Medicine   Contact information:   [address] [place] [address] [phone]       Follow up with Stan Head, MD. Schedule an appointment as soon as possible for a visit in 1 week.   Specialty:  Gastroenterology   Why:  Needs EGD   Contact information:   [address] [place] [address] [phone]         Discharge  Instructions  and  Discharge Medications          Discharge Instructions    Diet - low sodium heart healthy    Complete by:  As directed      Discharge instructions    Complete by:  As directed   Follow with Primary MD Redmond Baseman, MD in 7 days   Get CBC, CMP, 2 view Chest X ray checked  by Primary MD next visit.    Activity: As tolerated with Full fall precautions use walker/cane & assistance as needed   Disposition Home     Diet: Heart Healthy    For Heart failure patients - Check your Weight same time everyday, if you gain over 2 pounds, or you develop in leg swelling, experience more shortness of breath or chest pain, call your Primary MD immediately. Follow Cardiac Low Salt Diet and 1.5 lit/day fluid restriction.   On your next visit with your primary care physician please Get Medicines reviewed and adjusted.   Please request your Prim.MD to go over all Hospital Tests and Procedure/Radiological results at the follow up, please get all Hospital records sent to your Prim MD by signing hospital release before you go home.   If you experience worsening of your admission symptoms, develop shortness of breath, life threatening emergency, suicidal or homicidal thoughts you must seek medical attention immediately by calling 911 or calling your MD immediately  if symptoms less severe.  You Must read complete instructions/literature along with all the possible adverse reactions/side effects for all the Medicines you take and that have been prescribed to you. Take any new Medicines after you have completely understood and accpet all the possible adverse reactions/side effects.   Do not drive, operating heavy machinery, perform activities at heights, swimming or participation in water activities or provide baby sitting services if your were admitted for syncope or siezures until you have seen by Primary MD or a Neurologist and advised to do so again.  Do not drive when taking  Pain medications.    Do not take more than prescribed Pain, Sleep and Anxiety Medications  Special Instructions: If you have smoked or chewed Tobacco  in the last 2 yrs please stop smoking, stop any regular Alcohol  and or any Recreational drug use.  Wear Seat belts while driving.   Please note  You were cared for by a hospitalist during your hospital stay. If you have any questions about your discharge medications or the care you received while you were in the hospital after you are discharged, you can call the unit and asked to speak with the hospitalist on call if the hospitalist that took care of you is not available. Once you are discharged, your primary care  physician will handle any further medical issues. Please note that NO REFILLS for any discharge medications will be authorized once you are discharged, as it is imperative that you return to your primary care physician (or establish a relationship with a primary care physician if you do not have one) for your aftercare needs so that they can reassess your need for medications and monitor your lab values.     Increase activity slowly    Complete by:  As directed             Medication List    STOP taking these medications        VALSARTAN PO      TAKE these medications        carvedilol 6.25 MG tablet  Commonly known as:  COREG  Take 1 tablet (6.25 mg total) by mouth 2 (two) times daily with a meal.     Exenatide ER 2 MG Pen  Inject 2 mg into the skin every 7 (seven) days. Sunday     glimepiride 4 MG tablet  Commonly known as:  AMARYL  Take 4 mg by mouth 2 (two) times daily.     insulin glargine 100 UNIT/ML injection  Commonly known as:  LANTUS  Inject 40 Units into the skin every morning.     insulin glulisine 100 UNIT/ML injection  Commonly known as:  APIDRA  Inject 15-20 Units into the skin every evening.     metFORMIN 1000 MG tablet  Commonly known as:  GLUCOPHAGE  Take 1,000 mg by mouth 2 (two) times  daily with a meal.     pantoprazole 40 MG tablet  Commonly known as:  PROTONIX  Take 1 tablet (40 mg total) by mouth 2 (two) times daily.     testosterone 50 MG/5GM (1%) Gel  Commonly known as:  ANDROGEL  Place 5 g onto the skin daily.          Diet and Activity recommendation: See Discharge Instructions above   Consults obtained - None   Major procedures and Radiology Reports - PLEASE review detailed and final reports for all details, in brief -       Dg Chest 2 View  10/09/2014   CLINICAL DATA:  Acute onset of cough and sore throat for more than 1 week. Nausea, vomiting, diarrhea and fever. Initial encounter.  EXAM: CHEST  2 VIEW  COMPARISON:  None.  FINDINGS: The lungs are well-aerated and clear. There is no evidence of focal opacification, pleural effusion or pneumothorax.  The heart is normal in size; the mediastinal contour is within normal limits. No acute osseous abnormalities are seen. A metallic density overlying the left side of the neck appears to be outside the patient.  IMPRESSION: No acute cardiopulmonary process seen; no displaced rib fractures identified.   Electronically Signed   By: Roanna RaiderJeffery  Chang M.D.   On: 10/09/2014 19:10   Dg Abd 1 View  10/09/2014   CLINICAL DATA:  Vomiting.  Abdominal pain.  Duration:  1 day  EXAM: ABDOMEN - 1 VIEW  COMPARISON:  08/03/2013  FINDINGS: Upper normal amount of stool in the colon. The small bowel is primarily gasless. No significant abnormal calcifications observed.  IMPRESSION: 1. The small bowel is relatively gasless, which can reduced negative predictive value of conventional radiography in assessing the small bowel. Upper normal amount of stool in the colon.   Electronically Signed   By: Gaylyn RongWalter  Liebkemann M.D.   On: 10/09/2014 22:25  Micro Results      No results found for this or any previous visit (from the past 240 hour(s)).     Today   Subjective:   Joel Mcdonald today has no headache,no chest abdominal  pain,no new weakness tingling or numbness, feels much better wants to go home today.    Objective:   Blood pressure 126/82, pulse 107, temperature 98.7 F (37.1 C), temperature source Oral, resp. rate 18, height  (1.854 m), weight 109.952 kg (242 lb 6.4 oz), SpO2 95 %.   Intake/Output Summary (Last 24 hours) at 10/10/14 1408 Last data filed at 10/10/14 1337  Gross per 24 hour  Intake   2387 ml  Output   2000 ml  Net    387 ml    Exam Awake Alert, Oriented x 3, No new F.N deficits, Normal affect [place].AT,PERRAL Supple Neck,No JVD, No cervical lymphadenopathy appriciated.  Symmetrical Chest wall movement, Good air movement bilaterally, CTAB RRR,No Gallops,Rubs or new Murmurs, No Parasternal Heave +ve B.Sounds, Abd Soft, Non tender, No organomegaly appriciated, No rebound -guarding or rigidity. No Cyanosis, Clubbing or edema, No new Rash or bruise  Data Review   CBC w Diff:  Lab Results  Component Value Date   WBC 10.1 10/10/2014   HGB 13.1 10/10/2014   HCT 39.3 10/10/2014   PLT 225 10/10/2014   LYMPHOPCT 26 10/10/2014   MONOPCT 10 10/10/2014   EOSPCT 2 10/10/2014   BASOPCT 0 10/10/2014    CMP:  Lab Results  Component Value Date   NA 137 10/10/2014   K 4.7 10/10/2014   CL 104 10/10/2014   CO2 29 10/10/2014   BUN 22 10/10/2014   CREATININE 1.64* 10/10/2014   PROT 7.5 10/10/2014   ALBUMIN 3.1* 10/10/2014   BILITOT 0.6 10/10/2014   ALKPHOS 60 10/10/2014   AST 22 10/10/2014   ALT 19 10/10/2014  .   Total Time in preparing paper work, data evaluation and todays exam - 35 minutes  Leroy Sea M.D on 10/10/2014 at 2:08 PM  [facility]   Office  [phone]

## 2014-10-11 LAB — T3, FREE: T3, Free: 2.9 pg/mL (ref 2.0–4.4)

## 2014-12-25 ENCOUNTER — Other Ambulatory Visit: Payer: Self-pay | Admitting: Family Medicine

## 2014-12-25 DIAGNOSIS — R7989 Other specified abnormal findings of blood chemistry: Secondary | ICD-10-CM

## 2014-12-25 DIAGNOSIS — N189 Chronic kidney disease, unspecified: Secondary | ICD-10-CM

## 2015-01-22 ENCOUNTER — Ambulatory Visit
Admission: RE | Admit: 2015-01-22 | Discharge: 2015-01-22 | Disposition: A | Discharge status: Home / Self Care | Payer: Commercial Managed Care - PPO | Source: Ambulatory Visit | Attending: Family Medicine | Admitting: Family Medicine

## 2015-01-22 DIAGNOSIS — R7989 Other specified abnormal findings of blood chemistry: Secondary | ICD-10-CM

## 2015-01-22 DIAGNOSIS — N189 Chronic kidney disease, unspecified: Secondary | ICD-10-CM

## 2015-10-24 DIAGNOSIS — E785 Hyperlipidemia, unspecified: Secondary | ICD-10-CM | POA: Insufficient documentation

## 2015-10-24 DIAGNOSIS — I10 Essential (primary) hypertension: Secondary | ICD-10-CM | POA: Insufficient documentation

## 2015-11-16 ENCOUNTER — Emergency Department (HOSPITAL_COMMUNITY)
Admission: EM | Admit: 2015-11-16 | Discharge: 2015-11-16 | Disposition: A | Discharge status: Home / Self Care | Payer: Commercial Managed Care - PPO | Attending: Emergency Medicine | Admitting: Emergency Medicine

## 2015-11-16 ENCOUNTER — Emergency Department (HOSPITAL_COMMUNITY): Payer: Commercial Managed Care - PPO

## 2015-11-16 ENCOUNTER — Encounter (HOSPITAL_COMMUNITY): Payer: Self-pay

## 2015-11-16 DIAGNOSIS — E119 Type 2 diabetes mellitus without complications: Secondary | ICD-10-CM | POA: Diagnosis not present

## 2015-11-16 DIAGNOSIS — Y9389 Activity, other specified: Secondary | ICD-10-CM | POA: Insufficient documentation

## 2015-11-16 DIAGNOSIS — S8992XA Unspecified injury of left lower leg, initial encounter: Secondary | ICD-10-CM | POA: Diagnosis not present

## 2015-11-16 DIAGNOSIS — S01111A Laceration without foreign body of right eyelid and periocular area, initial encounter: Secondary | ICD-10-CM | POA: Insufficient documentation

## 2015-11-16 DIAGNOSIS — Y998 Other external cause status: Secondary | ICD-10-CM | POA: Diagnosis not present

## 2015-11-16 DIAGNOSIS — Z79899 Other long term (current) drug therapy: Secondary | ICD-10-CM | POA: Insufficient documentation

## 2015-11-16 DIAGNOSIS — S0990XA Unspecified injury of head, initial encounter: Secondary | ICD-10-CM | POA: Insufficient documentation

## 2015-11-16 DIAGNOSIS — Z7984 Long term (current) use of oral hypoglycemic drugs: Secondary | ICD-10-CM | POA: Insufficient documentation

## 2015-11-16 DIAGNOSIS — S8991XA Unspecified injury of right lower leg, initial encounter: Secondary | ICD-10-CM | POA: Insufficient documentation

## 2015-11-16 DIAGNOSIS — Z794 Long term (current) use of insulin: Secondary | ICD-10-CM | POA: Diagnosis not present

## 2015-11-16 DIAGNOSIS — Y9289 Other specified places as the place of occurrence of the external cause: Secondary | ICD-10-CM | POA: Insufficient documentation

## 2015-11-16 DIAGNOSIS — R55 Syncope and collapse: Secondary | ICD-10-CM | POA: Insufficient documentation

## 2015-11-16 DIAGNOSIS — W1839XA Other fall on same level, initial encounter: Secondary | ICD-10-CM | POA: Insufficient documentation

## 2015-11-16 DIAGNOSIS — I10 Essential (primary) hypertension: Secondary | ICD-10-CM | POA: Insufficient documentation

## 2015-11-16 LAB — CBC
HCT: 36.8 % — ABNORMAL LOW (ref 39.0–52.0)
HEMOGLOBIN: 12 g/dL — AB (ref 13.0–17.0)
MCH: 27.8 pg (ref 26.0–34.0)
MCHC: 32.6 g/dL (ref 30.0–36.0)
MCV: 85.2 fL (ref 78.0–100.0)
Platelets: 167 10*3/uL (ref 150–400)
RBC: 4.32 MIL/uL (ref 4.22–5.81)
RDW: 13.1 % (ref 11.5–15.5)
WBC: 10.4 10*3/uL (ref 4.0–10.5)

## 2015-11-16 LAB — BASIC METABOLIC PANEL
Anion gap: 10 (ref 5–15)
BUN: 20 mg/dL (ref 6–20)
CALCIUM: 8.6 mg/dL — AB (ref 8.9–10.3)
CO2: 23 mmol/L (ref 22–32)
Chloride: 113 mmol/L — ABNORMAL HIGH (ref 101–111)
Creatinine, Ser: 1.9 mg/dL — ABNORMAL HIGH (ref 0.61–1.24)
GFR calc non Af Amer: 38 mL/min — ABNORMAL LOW (ref >60)
GFR, EST AFRICAN AMERICAN: 45 mL/min — AB (ref >60)
Glucose, Bld: 58 mg/dL — ABNORMAL LOW (ref 65–99)
POTASSIUM: 3.8 mmol/L (ref 3.5–5.1)
Sodium: 146 mmol/L — ABNORMAL HIGH (ref 135–145)

## 2015-11-16 LAB — CBG MONITORING, ED
Glucose-Capillary: 58 mg/dL — ABNORMAL LOW (ref 65–99)
Glucose-Capillary: 154 mg/dL — ABNORMAL HIGH (ref 65–99)

## 2015-11-16 LAB — TROPONIN I

## 2015-11-16 MED ORDER — SODIUM CHLORIDE 0.9 % IV BOLUS (SEPSIS)
1000.0000 mL | Freq: Once | INTRAVENOUS | Status: AC
  Administered 2015-11-16: 1000 mL via INTRAVENOUS

## 2015-11-16 NOTE — ED Provider Notes (Signed)
CSN: [id number]     Arrival date & time 11/16/15  1619 History   First MD Initiated Contact with Patient 11/16/15 1629     Chief Complaint  Patient presents with  . Loss of Consciousness     (Consider location/radiation/quality/duration/timing/severity/associated sxs/prior Treatment) HPI Comments: 55 year old male with history of diabetes, high blood pressure, renal insufficiency presents after multiple syncopal episodes. Patient was going into the [employer] to fix something and passed out falling on the ground. Family helped him up and he became lightheaded again and passed out again. Patient did have head injury and knee injury bilateral. Patient passed out once in the distant past. No classic blood clot risk factors. Patient has no known cardiac disease. No chest pain or shortness of breath today. Patient feels well currently other than knee abrasion and tenderness. No blood in the stools. No valve disease. Decreased by mouth intake since yesterday. Patient felt generally weak before he passed out.  Patient is a 55 y.o. male presenting with syncope. The history is provided by the patient and medical records.  Loss of Consciousness Associated symptoms: weakness (general)   Associated symptoms: no chest pain, no fever, no headaches, no shortness of breath and no vomiting     Past Medical History  Diagnosis Date  . Hypertension   . Diabetes mellitus without complication [facility])    Past Surgical History  Procedure Laterality Date  . Testicle torsion reduction     Family History  Problem Relation Age of Onset  . Diabetes Mellitus II Sister    Social History  Substance Use Topics  . Smoking status: Never Smoker   . Smokeless tobacco: Never Used  . Alcohol Use: No    Review of Systems  Constitutional: Negative for fever and chills.  HENT: Negative for congestion.   Eyes: Negative for visual disturbance.  Respiratory: Negative for shortness of breath.   Cardiovascular: Positive for  syncope. Negative for chest pain.  Gastrointestinal: Negative for vomiting and abdominal pain.  Genitourinary: Negative for dysuria and flank pain.  Musculoskeletal: Negative for back pain, neck pain and neck stiffness.  Skin: Negative for rash.  Neurological: Positive for weakness (general) and light-headedness. Negative for headaches.      Allergies  Review of patient's allergies indicates no known allergies.  Home Medications   Prior to Admission medications   Medication Sig Start Date End Date Taking? Authorizing Provider  atorvastatin (LIPITOR) 40 MG tablet Take 40 mg by mouth every morning.   Yes Historical Provider, MD  carvedilol (COREG) 6.25 MG tablet Take 1 tablet (6.25 mg total) by mouth 2 (two) times daily with a meal. 10/10/14  Yes Leroy Sea, MD  Exenatide ER (BYDUREON) 2 MG SRER Inject 2 mg into the skin every Sunday.  06/28/14  Yes Historical Provider, MD  glimepiride (AMARYL) 4 MG tablet Take 4 mg by mouth 2 (two) times daily.   Yes Historical Provider, MD  insulin lispro (HUMALOG) 100 UNIT/ML injection Inject 76 Units into the skin continuous. 09/05/15  Yes Historical Provider, MD  metFORMIN (GLUCOPHAGE) 1000 MG tablet Take 1,000 mg by mouth daily with breakfast.    Yes Historical Provider, MD  testosterone (ANDROGEL) 50 MG/5GM (1%) GEL Place 5 g onto the skin daily.   Yes Historical Provider, MD  valsartan-hydrochlorothiazide (DIOVAN-HCT) 320-25 MG tablet Take 1 tablet by mouth daily. 08/17/08  Yes Historical Provider, MD   BP 160/100 mmHg  Pulse 85  Temp(Src) 97.6 F (36.4 C) (Oral)  Resp 15  SpO2  98% Physical Exam  Constitutional: He is oriented to person, place, and time. He appears well-developed and well-nourished.  HENT:  Head: Normocephalic.  Dry mm 1 cm superificial lac above right eye brow, no midline vertebral tenderness c/th/lumbar  Eyes: Conjunctivae are normal. Right eye exhibits no discharge. Left eye exhibits no discharge.  Neck: Normal  range of motion. Neck supple. No tracheal deviation present.  Cardiovascular: Normal rate, regular rhythm and intact distal pulses.   No murmur heard. Pulmonary/Chest: Effort normal and breath sounds normal.  Abdominal: Soft. He exhibits no distension. There is no tenderness. There is no guarding.  Musculoskeletal: He exhibits no edema.  Neurological: He is alert and oriented to person, place, and time.  Skin: Skin is warm. No rash noted.  Psychiatric: He has a normal mood and affect.  Nursing note and vitals reviewed.   ED Course  Procedures (including critical care time) Labs Review Labs Reviewed  BASIC METABOLIC PANEL - Abnormal; Notable for the following:    Sodium 146 (*)    Chloride 113 (*)    Glucose, Bld 58 (*)    Creatinine, Ser 1.90 (*)    Calcium 8.6 (*)    GFR calc non Af Amer 38 (*)    GFR calc Af Amer 45 (*)    All other components within normal limits  CBC - Abnormal; Notable for the following:    Hemoglobin 12.0 (*)    HCT 36.8 (*)    All other components within normal limits  CBG MONITORING, ED - Abnormal; Notable for the following:    Glucose-Capillary 58 (*)    All other components within normal limits  TROPONIN I  URINALYSIS, ROUTINE W REFLEX MICROSCOPIC (NOT AT [facility])    Imaging Review Ct Head Wo Contrast  11/16/2015  CLINICAL DATA:  Syncope. Fall. Hit front of head. Questionable loss of consciousness. EXAM: CT HEAD WITHOUT CONTRAST TECHNIQUE: Contiguous axial images were obtained from the base of the skull through the vertex without intravenous contrast. COMPARISON:  None. FINDINGS: No acute cortical infarct, hemorrhage, or mass lesion ispresent. Ventricles are of normal size. No significant extra-axial fluid collection is present. The paranasal sinuses andmastoid air cells are clear. The osseous skull is intact. IMPRESSION: 1. No acute intracranial abnormalities.  Normal brain. Electronically Signed   By: Signa Kell M.D.   On: 11/16/2015 18:01   Dg  Knee Complete 4 Views Left  11/16/2015  CLINICAL DATA:  55 year old male with history of pain and abrasions in the knees bilaterally after falling from a standing position onto a gravel driveway. EXAM: LEFT KNEE - COMPLETE 4+ VIEW COMPARISON:  No priors. FINDINGS: There is no evidence of fracture, dislocation, or joint effusion. There is no evidence of arthropathy or other focal bone abnormality. Soft tissues are unremarkable. IMPRESSION: Negative. Electronically Signed   By: Trudie Reed M.D.   On: 11/16/2015 17:38   Dg Knee Complete 4 Views Right  11/16/2015  CLINICAL DATA:  Acute right knee pain after fall today. Initial encounter. EXAM: RIGHT KNEE - COMPLETE 4+ VIEW COMPARISON:  None. FINDINGS: There is no evidence of fracture, dislocation, or joint effusion. There is no evidence of arthropathy or other focal bone abnormality. Soft tissues are unremarkable. IMPRESSION: Normal right knee. Electronically Signed   By: Lupita Raider, M.D.   On: 11/16/2015 17:39   I have personally reviewed and evaluated these images and lab results as part of my medical decision-making.   EKG Interpretation   Date/Time:  Saturday  November 16 2015 16:26:20 EDT Ventricular Rate:  85 PR Interval:  170 QRS Duration: 94 QT Interval:  370 QTC Calculation: 440 R Axis:   -13 Text Interpretation:  Sinus rhythm Confirmed by Creston Klas  MD, Deasia Chiu ([id number])  on 11/16/2015 7:51:54 PM      MDM   Final diagnoses:  Syncope and collapse  Acute head injury, initial encounter   Patient presents after multiple syncopal episodes. Gradual onset patient felt symptoms prior to. Patient has no symptoms except for knee pain bilateral. X-rays no acute fracture. CT head no acute findings. Screening blood work a patient baseline. Discussed importance of close follow-up with cardiology this week to discuss monitor/echo or other workup. Patient comfortable this plan and reasons to return discussed. Meal given in the ER, patient will  ambulate prior to discharge.  Results and differential diagnosis were discussed with the patient/parent/guardian. Xrays were independently reviewed by myself.  Close follow up outpatient was discussed, comfortable with the plan.   Medications  sodium chloride 0.9 % bolus 1,000 mL (0 mLs Intravenous Stopped 11/16/15 1836)  sodium chloride 0.9 % bolus 1,000 mL (0 mLs Intravenous Stopped 11/16/15 1836)    Filed Vitals:   11/16/15 1650 11/16/15 1830 11/16/15 1837 11/16/15 1900  BP: 114/74 116/79  160/100  Pulse: 83 82  85  Temp:   97.6 F (36.4 C)   TempSrc:      Resp: 17 18  15   SpO2: 98% 100%  98%    Final diagnoses:  Syncope and collapse  Acute head injury, initial encounter      Blane OharaJoshua Aniyiah Zell, MD 11/16/15 [id number]

## 2015-11-16 NOTE — ED Notes (Signed)
PER EMS: pt from home, four syncopal episodes PTA. Pt had been outside cutting grass and was fixing the lawn mower when he started to feel bad. He started walking and next thing he remembers was waking up in the grass (unwitnessed first syncopal episode). He got up, passed out two more times with his father. EMS arrived and was trying to get pt into truck and he had fourth episode of syncope. BP- 60/30 initially. EMS adm 1L normal saline and BP increased to 79/48. HR-80 sinus rhythm, CBG-106 O2-100. Pt A&Ox4.  Last oral intake for food and or water was last night. Pt has abrasions to knees and one small abrasion above right eye and he complains of left ankle pain. Denies neck/back pain. Not on blood thinners. BP on arrival 108/50, HR-83.

## 2015-11-16 NOTE — Discharge Instructions (Signed)
Stay well-hydrated and follow-up with cardiology to discuss possible monitor and cardiac ultrasound. Return to the ER for chest pain, shortness of breath, if you pass out again, blood in the stools or other concerns  If you were given medicines take as directed.  If you are on coumadin or contraceptives realize their levels and effectiveness is altered by many different medicines.  If you have any reaction (rash, tongues swelling, other) to the medicines stop taking and see a physician.    If your blood pressure was elevated in the ER make sure you follow up for management with a primary doctor or return for chest pain, shortness of breath or stroke symptoms.  Please follow up as directed and return to the ER or see a physician for new or worsening symptoms.  Thank you. Filed Vitals:   11/16/15 1650 11/16/15 1830 11/16/15 1837 11/16/15 1900  BP: 114/74 116/79  160/100  Pulse: 83 82  85  Temp:   97.6 F (36.4 C)   TempSrc:      Resp: 17 18  15   SpO2: 98% 100%  98%

## 2015-11-22 ENCOUNTER — Ambulatory Visit (INDEPENDENT_AMBULATORY_CARE_PROVIDER_SITE_OTHER): Payer: Commercial Managed Care - PPO | Admitting: Physician Assistant

## 2015-11-22 ENCOUNTER — Encounter: Payer: Self-pay | Admitting: Physician Assistant

## 2015-11-22 VITALS — BP 140/90 | HR 88 | Ht 73.0 in | Wt 244.6 lb

## 2015-11-22 DIAGNOSIS — N289 Disorder of kidney and ureter, unspecified: Secondary | ICD-10-CM

## 2015-11-22 DIAGNOSIS — R55 Syncope and collapse: Secondary | ICD-10-CM | POA: Diagnosis not present

## 2015-11-22 LAB — BASIC METABOLIC PANEL
BUN: 24 mg/dL (ref 7–25)
CALCIUM: 9.8 mg/dL (ref 8.6–10.3)
CHLORIDE: 98 mmol/L (ref 98–110)
CO2: 31 mmol/L (ref 20–31)
CREATININE: 1.46 mg/dL — AB (ref 0.70–1.33)
Glucose, Bld: 379 mg/dL — ABNORMAL HIGH (ref 65–99)
Potassium: 5.1 mmol/L (ref 3.5–5.3)
Sodium: 138 mmol/L (ref 135–146)

## 2015-11-22 NOTE — Addendum Note (Signed)
Addended by: Wendall StadeNISHAN, Zahriah Roes C on: 11/22/2015 10:33 AM   Modules accepted: Level of Service

## 2015-11-22 NOTE — Patient Instructions (Addendum)
Medication Instructions:  Your physician recommends that you continue on your current medications as directed. Please refer to the Current Medication list given to you today.   Labwork: TODAY;  BMET  Testing/Procedures: Your physician has requested that you have an echocardiogram BEFORE 12/09/15.   Echocardiography is a painless test that uses sound waves to create images of your heart. It provides your doctor with information about the size and shape of your heart and how well your heart's chambers and valves are working. This procedure takes approximately one hour. There are no restrictions for this procedure.  Your physician has requested that you have an exercise tolerance test BEFORE 12/09/15.  For further information please visit [email or web address]. Please also follow instruction sheet, as given.   Follow-Up: Your physician recommends that you schedule a follow-up appointment in: 12/09/15 WITH DR. Eden EmmsNISHAN ARRIVE AT 10:30   Any Other Special Instructions Will Be Listed Below (If Applicable).  Echocardiogram An echocardiogram, or echocardiography, uses sound waves (ultrasound) to produce an image of your heart. The echocardiogram is simple, painless, obtained within a short period of time, and offers valuable information to your health care provider. The images from an echocardiogram can provide information such as:  Evidence of coronary artery disease (CAD).  Heart size.  Heart muscle function.  Heart valve function.  Aneurysm detection.  Evidence of a past heart attack.  Fluid buildup around the heart.  Heart muscle thickening.  Assess heart valve function. LET [facility]ARE PROVIDER KNOW ABOUT:  Any allergies you have.  All medicines you are taking, including vitamins, herbs, eye drops, creams, and over-the-counter medicines.  Previous problems you or members of your family have had with the use of anesthetics.  Any blood disorders you have.  Previous surgeries  you have had.  Medical conditions you have.  Possibility of pregnancy, if this applies. BEFORE THE PROCEDURE  No special preparation is needed. Eat and drink normally.  PROCEDURE   In order to produce an image of your heart, gel will be applied to your chest and a wand-like tool (transducer) will be moved over your chest. The gel will help transmit the sound waves from the transducer. The sound waves will harmlessly bounce off your heart to allow the heart images to be captured in real-time motion. These images will then be recorded.  You may need an IV to receive a medicine that improves the quality of the pictures. AFTER THE PROCEDURE You may return to your normal schedule including diet, activities, and medicines, unless your health care provider tells you otherwise.   This information is not intended to replace advice given to you by your health care provider. Make sure you discuss any questions you have with your health care provider.   Document Released: 07/03/2000 Document Revised: 07/27/2014 Document Reviewed: 03/13/2013 Elsevier Interactive Patient Education 2016 [employer]   Exercise Stress Electrocardiogram An exercise stress electrocardiogram is a test to check how blood flows to your heart. It is done to find areas of poor blood flow. You will need to walk on a treadmill for this test. The electrocardiogram will record your heartbeat when you are at rest and when you are exercising. BEFORE THE PROCEDURE  Do not have drinks with caffeine or foods with caffeine for 24 hours before the test, or as told by your doctor. This includes coffee, tea (even decaf tea), sodas, chocolate, and cocoa.  Follow your doctor's instructions about eating and drinking before the test.  Ask your doctor what medicines  you should or should not take before the test. Take your medicines with water unless told by your doctor not to.  If you use an inhaler, bring it with you to the test.  Bring  a snack to eat after the test.  Do not  smoke for 4 hours before the test.  Do not put lotions, powders, creams, or oils on your chest before the test.  Wear comfortable shoes and clothing. PROCEDURE  You will have patches put on your chest. Small areas of your chest may need to be shaved. Wires will be connected to the patches.  Your heart rate will be watched while you are resting and while you are exercising.  You will walk on the treadmill. The treadmill will slowly get faster to raise your heart rate.  The test will take about 1-2 hours. AFTER THE PROCEDURE  Your heart rate and blood pressure will be watched after the test.  You may return to your normal diet, activities, and medicines or as told by your doctor.   This information is not intended to replace advice given to you by your health care provider. Make sure you discuss any questions you have with your health care provider.   Document Released: 12/23/2007 Document Revised: 07/27/2014 Document Reviewed: 03/13/2013 Elsevier Interactive Patient Education [employer]    If you need a refill on your cardiac medications before your next appointment, please call your pharmacy.

## 2015-11-22 NOTE — Progress Notes (Signed)
Cardiology Office Note   Date:  11/22/2015   ID:  Joel Mcdonald, DOB 08/18/1960, MRN [id number]  PCP:  Joel BasemanWONG,FRANCIS PATRICK, MD  Cardiologist:  Joel Mcdonald    No chief complaint on file.   History of Present Illness: Joel Mcdonald is a 55 y.o. male with a history of DM, HTN, CKD III, no hx CAD.  D/c 03/23 after admit for chest pain, felt 2nd hiccups and esophagitis/gastritis.  04/29 ER visit for syncope, weakness>>cards f/u requested.   Joel Mcdonald presents for evaluation of syncope.  [name] had never passed out. He has had orthostatic dizziness in the past, at church and sometimes at work. On further questioning, most of the episodes occur in the late morning. At work, he drinks a good amount of water. He does not eat breakfast.  On the day had syncope, he had not eaten breakfast. His morning blood sugar was over 300. He had not had very much water. He had been working outside and had been sweating. When he stood up, he became lightheaded and passed out. It happened a second time the same way. His father got him some water but it was extremely cold: He was not able to drink much. As long as he was lying down, he felt fine.  He had no palpitations at any time. He had no chest pain. He was not short of breath or nauseated.  EMS was called, he was so diaphoretic, they could not get the electrodes to stick for the ECG. In the emergency room, his creatinine was up above normal and his sodium was elevated as well. He was given 2 L of IV fluids and his symptoms improved.  He has some abrasions that are healing. He has a small amount of swelling in his left foot. In general, he thinks he is recovering from the injuries he received in his falls well.   Past Medical History  Diagnosis Date  . Hypertension   . Diabetes mellitus without complication [facility])     Past Surgical History  Procedure Laterality Date  . Testicle torsion reduction      Current Outpatient  Prescriptions  Medication Sig Dispense Refill  . atorvastatin (LIPITOR) 40 MG tablet Take 40 mg by mouth every morning.    . carvedilol (COREG) 6.25 MG tablet Take 1 tablet (6.25 mg total) by mouth 2 (two) times daily with a meal. 60 tablet 0  . Exenatide ER (BYDUREON) 2 MG SRER Inject 2 mg into the skin every Sunday.     Marland Kitchen. glimepiride (AMARYL) 4 MG tablet Take 4 mg by mouth 2 (two) times daily.    . insulin lispro (HUMALOG) 100 UNIT/ML injection Inject 76 Units into the skin continuous.    . metFORMIN (GLUCOPHAGE) 1000 MG tablet Take 1,000 mg by mouth daily with breakfast.     . testosterone (ANDROGEL) 50 MG/5GM (1%) GEL Place 5 g onto the skin daily.    . valsartan-hydrochlorothiazide (DIOVAN-HCT) 320-25 MG tablet Take 1 tablet by mouth daily.     No current facility-administered medications for this visit.    Allergies:   Review of patient's allergies indicates no known allergies.    Social History:  The patient  reports that he has never smoked. He has never used smokeless tobacco. He reports that he does not drink alcohol or use illicit drugs.   Family History:  The patient's family history includes Diabetes Mellitus II in his sister; Hypertension in his father; Stroke  in his mother. There is no history of Heart attack.    ROS:  Please see the history of present illness. All other systems are reviewed and negative.    PHYSICAL EXAM: VS:  BP 140/90 mmHg  Pulse 88  Ht  (1.854 m)  Wt 244 lb 9.6 oz (110.95 kg)  BMI 32.28 kg/m2 , BMI Body mass index is 32.28 kg/(m^2). GEN: Well nourished, well developed, male in no acute distress HEENT: normal for age  Neck: no JVD, no carotid bruit, no masses Cardiac: RRR; no murmur, no rubs, or gallops Respiratory:  clear to auscultation bilaterally, normal work of breathing GI: soft, nontender, nondistended, + BS MS: no deformity or atrophy; no edema; distal pulses are 2+ in all 4 extremities. Left foot has mild edema but there is no  point tenderness and movement/sensation are intact. Skin: warm and dry, no rash Neuro:  Strength and sensation are intact Psych: euthymic mood, full affect   EKG:  The ekg from 04/29 is reviewed and demonstrates sinus rhythm, no Q waves noted, no ST depression or elevation   Recent Labs: 11/16/2015: BUN 20; Creatinine, Ser 1.90*; Hemoglobin 12.0*; Platelets 167; Potassium 3.8; Sodium 146*    Lipid Panel No results found for: CHOL, TRIG, HDL, CHOLHDL, VLDL, LDLCALC, LDLDIRECT   Wt Readings from Last 3 Encounters:  11/22/15 244 lb 9.6 oz (110.95 kg)  10/09/14 242 lb 6.4 oz (109.952 kg)     Other studies Reviewed: Additional studies/ records that were reviewed today include: ER notes and testing.  ASSESSMENT AND PLAN: Dr. Eden Emms was in to see the patient  1.  Syncope: His symptoms appear to be orthostatic in nature and improved with hydration. Because his creatinine was up more than usual and his sodium was elevated, we will recheck a BMET today.  There was no evidence of any arrhythmia, so no event monitor will be ordered at this time.  Suspect that he had a natural diuresis with his glucosuria which causes dehydration   We will check an echocardiogram and get a treadmill since ECG normal to evaluate for structural heart disease and exercise tolerance.   2. Diabetes: He admits that his blood sugars are rarely controlled frequently over 300. His last A1c was greater than 11. He is encouraged to follow-up with his endocrinologist and primary care physician to improve management of this  Long term risk of neuropathy, retinopathy , vascular disease and kidney failure discussed This is a huge health issue for him   3. Hypertension: His blood sugar is elevated today but he has not had his morning medications. Compliance with these is encouraged.   Current medicines are reviewed at length with the patient today.  The patient does not have concerns regarding medicines.  The following  changes have been made:  no change  Labs/ tests ordered today include:   Orders Placed This Encounter  Procedures  . Basic Metabolic Panel (BMET)  . Exercise Tolerance Test  . Echocardiogram     F/U with me post tests  Charlton Haws

## 2015-12-02 NOTE — Progress Notes (Signed)
Patient ID: Joel JanRicky D Buss, male   DOB: 02/02/1961, 55 y.o.   MRN: [id number]     Cardiology Office Note   Date:  12/09/2015   ID:  Joel JanRicky D Dusza, DOB 06/12/1961, MRN [id number]  PCP:  Redmond BasemanWONG,FRANCIS PATRICK, MD  Cardiologist:  [facility] Dr Eden EmmsNishan    Chief Complaint  Patient presents with  . syncope & collapse    no sx, follow up to echo & GXT    History of Present Illness: Joel Mcdonald is a 55 y.o. male with a history of DM, HTN, CKD III, no hx CAD.  D/c 10/10/15  after admit for chest pain, felt 2nd hiccups and esophagitis/gastritis.  11/16/15  ER visit for syncope, weakness>> seen by PA as new patient 11/22/15  Patient is new to me    Reviewing his ER visit for "syncope"  He has had orthostatic dizziness in the past, at church and sometimes at work. On further questioning, most of the episodes occur in the late morning. At work, he drinks a good amount of water. He does not eat breakfast.  On the day had syncope, he had not eaten breakfast. His morning blood sugar was over 300. He had not had very much water. He had been working outside and had been sweating. When he stood up, he became lightheaded and passed out. It happened a second time the same way. His father got him some water but it was extremely cold: He was not able to drink much. As long as he was lying down, he felt fine.  He had no palpitations at any time. He had no chest pain. He was not short of breath or nauseated.  EMS was called, he was so diaphoretic, they could not get the electrodes to stick for the ECG. In the emergency room, his creatinine was up above normal and his sodium was elevated as well. He was given 2 L of IV fluids and his symptoms improved.  PA ordered echo and ett to r.o structural heart disease but never done    He still has poor BS control has not been to see eye doctor.  Sees Dr Corwin LevinsWilliam Smith endocrinologist in [place] Last A1c over 11    Past Medical History  Diagnosis Date  . Hypertension   .  Diabetes mellitus without complication [facility](HCC)     Past Surgical History  Procedure Laterality Date  . Testicle torsion reduction      Current Outpatient Prescriptions  Medication Sig Dispense Refill  . atorvastatin (LIPITOR) 40 MG tablet Take 40 mg by mouth every morning.    . carvedilol (COREG) 6.25 MG tablet Take 1 tablet (6.25 mg total) by mouth 2 (two) times daily with a meal. 60 tablet 0  . Exenatide ER (BYDUREON) 2 MG SRER Inject 2 mg into the skin every Sunday.     Marland Kitchen. glimepiride (AMARYL) 4 MG tablet Take 4 mg by mouth 2 (two) times daily.    . insulin lispro (HUMALOG) 100 UNIT/ML injection Inject 76 Units into the skin continuous.    . metFORMIN (GLUCOPHAGE) 1000 MG tablet Take 1,000 mg by mouth daily with breakfast.     . testosterone (ANDROGEL) 50 MG/5GM (1%) GEL Place 5 g onto the skin daily.    . valsartan-hydrochlorothiazide (DIOVAN-HCT) 320-25 MG tablet Take 1 tablet by mouth daily.     No current facility-administered medications for this visit.    Allergies:   Review of patient's allergies indicates no known allergies.    Social  History:  The patient  reports that he has never smoked. He has never used smokeless tobacco. He reports that he does not drink alcohol or use illicit drugs.   Family History:  The patient's family history includes Diabetes Mellitus II in his sister; Hypertension in his father; Stroke in his mother. There is no history of Heart attack.    ROS:  Please see the history of present illness. All other systems are reviewed and negative.    PHYSICAL EXAM: VS:  BP 136/80 mmHg  Pulse 103  Ht  (1.854 m)  Wt 109.68 kg (241 lb 12.8 oz)  BMI 31.91 kg/m2  SpO2 95% , BMI Body mass index is 31.91 kg/(m^2). GEN: Well nourished, well developed, male in no acute distress HEENT: normal for age  Neck: no JVD, no carotid bruit, no masses Cardiac: RRR; no murmur, no rubs, or gallops Respiratory:  clear to auscultation bilaterally, normal work of  breathing GI: soft, nontender, nondistended, + BS MS: no deformity or atrophy; no edema; distal pulses are 2+ in all 4 extremities. Left foot has mild edema but there is no point tenderness and movement/sensation are intact. Skin: warm and dry, no rash Neuro:  Strength and sensation are intact Psych: euthymic mood, full affect   EKG:  The ekg from 04/29 is reviewed and demonstrates sinus rhythm, no Q waves noted, no ST depression or elevation   Recent Labs: 11/16/2015: Hemoglobin 12.0*; Platelets 167 11/22/2015: BUN 24; Creat 1.46*; Potassium 5.1; Sodium 138    Lipid Panel No results found for: CHOL, TRIG, HDL, CHOLHDL, VLDL, LDLCALC, LDLDIRECT   Wt Readings from Last 3 Encounters:  12/09/15 109.68 kg (241 lb 12.8 oz)  11/22/15 110.95 kg (244 lb 9.6 oz)  10/09/14 109.952 kg (242 lb 6.4 oz)     Other studies Reviewed: Additional studies/ records that were reviewed today include: ER notes and testing.  ASSESSMENT AND PLAN: Dr. Eden Emms was in to see the patient  1.  Syncope: His symptoms appear to be orthostatic in nature and improved with hydration Elevated Na/Cr support this as well as his glycosurea     2. Diabetes: He admits that his blood sugars are rarely controlled frequently over 300. His last A1c was greater than 11. He is encouraged to follow-up with his endocrinologist and primary care physician to improve management of this  Long term risk of neuropathy, retinopathy , vascular disease and kidney failure discussed This is a huge health issue for him   3. Hypertension: His blood sugar is elevated today but he has not had his morning medications. Compliance with these is encouraged.   Current medicines are reviewed at length with the patient today.  The patient does not have concerns regarding medicines.  The following changes have been made:  no change  Labs/ tests ordered today include:   No orders of the defined types were placed in this encounter.     F/u with  mie in 6 months    Charlton Haws

## 2015-12-06 ENCOUNTER — Ambulatory Visit (HOSPITAL_COMMUNITY): Payer: Commercial Managed Care - PPO | Attending: Physician Assistant

## 2015-12-06 ENCOUNTER — Other Ambulatory Visit: Payer: Self-pay

## 2015-12-06 ENCOUNTER — Ambulatory Visit (INDEPENDENT_AMBULATORY_CARE_PROVIDER_SITE_OTHER): Payer: Commercial Managed Care - PPO

## 2015-12-06 DIAGNOSIS — I129 Hypertensive chronic kidney disease with stage 1 through stage 4 chronic kidney disease, or unspecified chronic kidney disease: Secondary | ICD-10-CM | POA: Diagnosis not present

## 2015-12-06 DIAGNOSIS — R079 Chest pain, unspecified: Secondary | ICD-10-CM | POA: Diagnosis not present

## 2015-12-06 DIAGNOSIS — N189 Chronic kidney disease, unspecified: Secondary | ICD-10-CM | POA: Insufficient documentation

## 2015-12-06 DIAGNOSIS — E1122 Type 2 diabetes mellitus with diabetic chronic kidney disease: Secondary | ICD-10-CM | POA: Insufficient documentation

## 2015-12-06 DIAGNOSIS — R55 Syncope and collapse: Secondary | ICD-10-CM

## 2015-12-06 LAB — EXERCISE TOLERANCE TEST
CHL CUP MPHR: 165 {beats}/min
CHL CUP RESTING HR STRESS: 96 {beats}/min
CHL CUP STRESS STAGE 1 GRADE: 0 %
CHL CUP STRESS STAGE 2 SPEED: 1 mph
CHL CUP STRESS STAGE 3 GRADE: 0.2 %
CHL CUP STRESS STAGE 3 HR: 98 {beats}/min
CHL CUP STRESS STAGE 3 SPEED: 1 mph
CHL CUP STRESS STAGE 4 DBP: 96 mmHg
CHL CUP STRESS STAGE 6 SPEED: 3.4 mph
CHL CUP STRESS STAGE 7 DBP: 71 mmHg
CHL CUP STRESS STAGE 7 GRADE: 0 %
CHL CUP STRESS STAGE 7 SBP: 218 mmHg
CHL CUP STRESS STAGE 7 SPEED: 0 mph
CSEPED: 6 min
CSEPEDS: 14 s
CSEPEW: 7.3 METS
CSEPPHR: 141 {beats}/min
Percent HR: 85 %
Percent of predicted max HR: 85 %
RPE: 15
Stage 1 DBP: 100 mmHg
Stage 1 HR: 97 {beats}/min
Stage 1 SBP: 187 mmHg
Stage 1 Speed: 0 mph
Stage 2 Grade: 0 %
Stage 2 HR: 97 {beats}/min
Stage 4 Grade: 10 %
Stage 4 HR: 121 {beats}/min
Stage 4 SBP: 217 mmHg
Stage 4 Speed: 1.7 mph
Stage 5 DBP: 82 mmHg
Stage 5 Grade: 12 %
Stage 5 HR: 137 {beats}/min
Stage 5 SBP: 226 mmHg
Stage 5 Speed: 2.5 mph
Stage 6 Grade: 14 %
Stage 6 HR: 141 {beats}/min
Stage 7 HR: 131 {beats}/min
Stage 8 Grade: 0 %
Stage 8 HR: 105 {beats}/min
Stage 8 Speed: 0 mph

## 2015-12-09 ENCOUNTER — Encounter: Payer: Self-pay | Admitting: Cardiovascular Disease

## 2015-12-09 ENCOUNTER — Ambulatory Visit (INDEPENDENT_AMBULATORY_CARE_PROVIDER_SITE_OTHER): Payer: Commercial Managed Care - PPO | Admitting: Cardiovascular Disease

## 2015-12-09 VITALS — BP 136/80 | HR 103 | Ht 73.0 in | Wt 241.8 lb

## 2015-12-09 DIAGNOSIS — R55 Syncope and collapse: Secondary | ICD-10-CM

## 2015-12-09 NOTE — Patient Instructions (Signed)

## 2016-02-27 ENCOUNTER — Ambulatory Visit: Payer: Commercial Managed Care - PPO | Admitting: Dietician

## 2016-03-19 ENCOUNTER — Encounter: Payer: Commercial Managed Care - PPO | Attending: Family Medicine | Admitting: Skilled Nursing Facility1

## 2016-03-19 ENCOUNTER — Encounter: Payer: Self-pay | Admitting: Skilled Nursing Facility1

## 2016-03-19 DIAGNOSIS — E119 Type 2 diabetes mellitus without complications: Secondary | ICD-10-CM | POA: Insufficient documentation

## 2016-03-19 DIAGNOSIS — Z6831 Body mass index (BMI) 31.0-31.9, adult: Secondary | ICD-10-CM | POA: Insufficient documentation

## 2016-03-19 DIAGNOSIS — Z713 Dietary counseling and surveillance: Secondary | ICD-10-CM | POA: Diagnosis not present

## 2016-03-19 NOTE — Patient Instructions (Addendum)
-  Always bring your meter with you everywhere you go -Always Properly dispose of your needles:  -Discard in a hard plastic/metal container with a lid (something the needle can't puncture)  -Write Do Not Recycle on the outside of the container  -Example: A laundry detergent bottle -Never use the same needle more than once -Eat 4 carbohydrate choices for each meal and 1 to 2 for each snack -A meal: carbohydrates, protein, vegetable -A snack: A Fruit OR Vegetable AND Protein  -Try to be more active -Always pay attention to your body keeping watchful of possible low blood sugar (below 70) or high blood sugar (above 200) -Check your feet every day -Always wash your hands before you check you check -[place] protein bars are a good snack -Try to limit fast food and soda

## 2016-03-19 NOTE — Progress Notes (Signed)
Diabetes Self-Management Education  Visit Type: First/Initial  Appt. Start Time: 2:00 Appt. End Time: 3:20  03/19/2016  [name], identified by name and date of birth, is a 55 y.o. male with a diagnosis of Diabetes: Type 2.   ASSESSMENT  Height 6\' 1"  (1.854 m), weight 237 lb (107.5 kg). Body mass index is 31.27 kg/m.      Diabetes Self-Management Education - 03/19/16 1405      Visit Information   Visit Type First/Initial     Initial Visit   Diabetes Type Type 2   Are you currently following a meal plan? No   Are you taking your medications as prescribed? No   Date Diagnosed 20 years ago     Health Coping   How would you rate your overall health? Good     Psychosocial Assessment   Patient Belief/Attitude about Diabetes Motivated to manage diabetes     Pre-Education Assessment   Patient understands the diabetes disease and treatment process. Needs Instruction   Patient understands incorporating nutritional management into lifestyle. Needs Instruction   Patient undertands incorporating physical activity into lifestyle. Needs Instruction   Patient understands using medications safely. Needs Instruction   Patient understands monitoring blood glucose, interpreting and using results Needs Instruction   Patient understands prevention, detection, and treatment of acute complications. Needs Instruction   Patient understands prevention, detection, and treatment of chronic complications. Needs Instruction   Patient understands how to develop strategies to address psychosocial issues. Needs Instruction   Patient understands how to develop strategies to promote health/change behavior. Needs Instruction     Complications   Last HgB A1C per patient/outside source 11 %   How often do you check your blood sugar? 1-2 times/day   Fasting Blood glucose range (mg/dL) 161-096;045-409   Have you had a dilated eye exam in the past 12 months? No   Have you had a dental exam in the  past 12 months? Yes   Are you checking your feet? No     Dietary Intake   Breakfast peanutbutter and jelly sandwich----hotdog   Snack (morning) apple----grits---peanut butter crackers   Lunch chinese food-----fast food burger   Dinner none   Beverage(s) water, soda, juice     Exercise   Exercise Type ADL's     Patient Education   Previous Diabetes Education No   Disease state  Definition of diabetes, type 1 and 2, and the diagnosis of diabetes;Factors that contribute to the development of diabetes   Nutrition management  Role of diet in the treatment of diabetes and the relationship between the three main macronutrients and blood glucose level;Food label reading, portion sizes and measuring food.;Carbohydrate counting;Reviewed blood glucose goals for pre and post meals and how to evaluate the patients' food intake on their blood glucose level.   Physical activity and exercise  Role of exercise on diabetes management, blood pressure control and cardiac health.;Identified with patient nutritional and/or medication changes necessary with exercise.   Monitoring Taught/evaluated SMBG meter.;Purpose and frequency of SMBG.;Yearly dilated eye exam;Daily foot exams;Identified appropriate SMBG and/or A1C goals.   Chronic complications Relationship between chronic complications and blood glucose control;Assessed and discussed foot care and prevention of foot problems;Reviewed with patient heart disease, higher risk of, and prevention;Dental care;Nephropathy, what it is, prevention of, the use of ACE, ARB's and early detection of through urine microalbumia.   Psychosocial adjustment Worked with patient to identify barriers to care and solutions;Role of stress on diabetes     Individualized  Goals (developed by patient)   Nutrition General guidelines for healthy choices and portions discussed;Adjust meds/carbs with exercise as discussed;Follow meal plan discussed   Physical Activity Exercise 1-2 times per  week;15 minutes per day   Monitoring  test my blood glucose as discussed;test blood glucose pre and post meals as discussed   Reducing Risk do foot checks daily;examine blood glucose patterns     Post-Education Assessment   Patient understands the diabetes disease and treatment process. Demonstrates understanding / competency   Patient understands incorporating nutritional management into lifestyle. Demonstrates understanding / competency   Patient undertands incorporating physical activity into lifestyle. Demonstrates understanding / competency   Patient understands using medications safely. Demonstrates understanding / competency   Patient understands monitoring blood glucose, interpreting and using results Demonstrates understanding / competency   Patient understands prevention, detection, and treatment of acute complications. Demonstrates understanding / competency   Patient understands prevention, detection, and treatment of chronic complications. Demonstrates understanding / competency   Patient understands how to develop strategies to address psychosocial issues. Demonstrates understanding / competency   Patient understands how to develop strategies to promote health/change behavior. Demonstrates understanding / competency     Outcomes   Expected Outcomes Demonstrated interest in learning. Expect positive outcomes   Future DMSE PRN   Program Status Completed      Individualized Plan for Diabetes Self-Management Training:   Learning Objective:  Patient will have a greater understanding of diabetes self-management. Patient education plan is to attend individual and/or group sessions per assessed needs and concerns.   Plan:   Patient Instructions  -Always bring your meter with you everywhere you go -Always Properly dispose of your needles:  -Discard in a hard plastic/metal container with a lid (something the needle can't puncture)  -Write Do Not Recycle on the outside of the  container  -Example: A laundry detergent bottle -Never use the same needle more than once -Eat 4 carbohydrate choices for each meal and 1 to 2 for each snack -A meal: carbohydrates, protein, vegetable -A snack: A Fruit OR Vegetable AND Protein  -Try to be more active -Always pay attention to your body keeping watchful of possible low blood sugar (below 70) or high blood sugar (above 200) -Check your feet every day -Always wash your hands before you check you check -   Expected Outcomes:  Demonstrated interest in learning. Expect positive outcomes  Education material provided: Living Well with Diabetes, My Plate and Snack sheet  If problems or questions, patient to contact team via:  Phone  Future DSME appointment: PRN

## 2016-03-30 ENCOUNTER — Encounter: Payer: Self-pay | Admitting: Skilled Nursing Facility1

## 2017-03-10 ENCOUNTER — Ambulatory Visit (INDEPENDENT_AMBULATORY_CARE_PROVIDER_SITE_OTHER): Payer: Commercial Managed Care - PPO

## 2017-03-10 ENCOUNTER — Ambulatory Visit (INDEPENDENT_AMBULATORY_CARE_PROVIDER_SITE_OTHER): Payer: Commercial Managed Care - PPO | Admitting: Podiatry

## 2017-03-10 ENCOUNTER — Telehealth: Payer: Self-pay | Admitting: *Deleted

## 2017-03-10 DIAGNOSIS — L97512 Non-pressure chronic ulcer of other part of right foot with fat layer exposed: Secondary | ICD-10-CM | POA: Diagnosis not present

## 2017-03-10 DIAGNOSIS — M79671 Pain in right foot: Secondary | ICD-10-CM

## 2017-03-10 DIAGNOSIS — I70235 Atherosclerosis of native arteries of right leg with ulceration of other part of foot: Secondary | ICD-10-CM

## 2017-03-10 DIAGNOSIS — E0842 Diabetes mellitus due to underlying condition with diabetic polyneuropathy: Secondary | ICD-10-CM

## 2017-03-10 NOTE — Telephone Encounter (Addendum)
Dr. Logan Bores ordered MRI of right foot r/o osteomyelitis. Orders faxed to [facility]. Gwinda Maine M.-NURSES LINE APPROVED MRI [id number], REF# [id number], VALID 03/10/2017 THROUGH 04/09/2017. I spoke with pt and he states they called him about 30 minutes ago and he is scheduled 03/12/2017.03/11/2017-[facility] called stat report to me and stated the results are also in the [facility]. Called stat MRI report to Dr. Logan Bores and he states pt is on antibiotic and coming back to office in one week. I told Dr. Logan Bores the MRI final report is in the [facility].

## 2017-03-10 NOTE — Addendum Note (Signed)
Addended by: Marcell Anger on: 03/10/2017 04:20 PM   Modules accepted: Orders

## 2017-03-10 NOTE — Addendum Note (Signed)
Addended by: Alphia Kava D on: 03/10/2017 03:53 PM   Modules accepted: Orders

## 2017-03-10 NOTE — Progress Notes (Signed)
   Subjective:  57 year old male with a history of uncontrolled diabetes mellitus presents today for evaluation ulcer to the plantar aspect of the right foot sub-second MPJ. Patient states that approximately 5-10 years ago he kicked a wall dislocated second toe. He slowly eventually developed a callus to the sub-second MPJ with ulceration over the past several months. She states that the right second toe is swollen and has drainage. He is currently taking Bactrim DS prescribed by his primary care physician. Patient states that he has never been able to get his diabetes under control.   Objective/Physical Exam General: The patient is alert and oriented x3 in no acute distress.  Dermatology:  Wound #1 noted to the plantar aspect of the sub-second MPJ right forefoot measuring approximately 1.01.0-3.0 cm (LxWxD).   To the noted ulceration(s), there is no eschar. There is a moderate amount of slough, fibrin, and necrotic tissue noted. Granulation tissue and wound base is red. There is a heavy amount of serosanguineous drainage noted. The ulceration does probe to bone and joint capsule. There is a somewhat strong malodor noted. Periwound integrity is significantly callused. Skin is warm, dry and supple bilateral lower extremities.  Vascular: Palpable pedal pulses bilaterally. Moderate edema with erythema noted localized second digit of the right foot. Capillary refill within normal limits.  Neurological: Epicritic and protective threshold absent bilaterally.   Musculoskeletal Exam: Range of motion within normal limits to all pedal and ankle joints bilateral. Muscle strength 5/5 in all groups bilateral.   Assessment: #1 right sub-second MPJ ulcer with cellulitis secondary to diabetes mellitus #2 diabetes mellitus w/ peripheral neuropathy #3 possible osteomyelitis metatarsal head right foot   Plan of Care:  #1 Patient was evaluated. #2 medically necessary excisional debridement including muscle  and deep fascial tissue was performed using a tissue nipper and a chisel blade. Excisional debridement of all the necrotic nonviable tissue down to healthy bleeding viable tissue was performed with post-debridement measurements same as pre-. #3 the wound was cleansed and dry sterile dressing applied. Recommend daily application of iodine dry sterile dressing. #4 continue Bactrim DS as prescribed by PCP .  #5 today cultures were taken and sent to pathology for culture and sensitivity   #6 today we are going to order an MRI right forefoot to determine level likely osteomyelitis #7 today we discussed the patient will likely need partial second ray amputation. 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 We will discuss postoperative shoe next visit. Patient does not want to take time off of work until the postoperative recovery.   Felecia Shelling, DPM [facility]  Dr. Felecia Shelling, DPM    [address]                                        [place], [place] [address]                Office [phone]  Fax [phone]

## 2017-03-11 ENCOUNTER — Ambulatory Visit (HOSPITAL_COMMUNITY)
Admission: RE | Admit: 2017-03-11 | Discharge: 2017-03-11 | Disposition: A | Discharge status: Home / Self Care | Payer: Commercial Managed Care - PPO | Source: Ambulatory Visit | Attending: Podiatry | Admitting: Podiatry

## 2017-03-11 DIAGNOSIS — I70235 Atherosclerosis of native arteries of right leg with ulceration of other part of foot: Secondary | ICD-10-CM

## 2017-03-11 DIAGNOSIS — X58XXXA Exposure to other specified factors, initial encounter: Secondary | ICD-10-CM | POA: Diagnosis not present

## 2017-03-11 DIAGNOSIS — S93104A Unspecified dislocation of right toe(s), initial encounter: Secondary | ICD-10-CM | POA: Diagnosis not present

## 2017-03-11 DIAGNOSIS — M009 Pyogenic arthritis, unspecified: Secondary | ICD-10-CM | POA: Diagnosis not present

## 2017-03-11 DIAGNOSIS — L97512 Non-pressure chronic ulcer of other part of right foot with fat layer exposed: Secondary | ICD-10-CM | POA: Insufficient documentation

## 2017-03-11 DIAGNOSIS — M869 Osteomyelitis, unspecified: Secondary | ICD-10-CM | POA: Insufficient documentation

## 2017-03-11 DIAGNOSIS — E0842 Diabetes mellitus due to underlying condition with diabetic polyneuropathy: Secondary | ICD-10-CM

## 2017-03-12 ENCOUNTER — Ambulatory Visit (HOSPITAL_COMMUNITY): Payer: Commercial Managed Care - PPO

## 2017-03-13 LAB — WOUND CULTURE
GRAM STAIN: NONE SEEN
GRAM STAIN: NONE SEEN

## 2017-03-15 ENCOUNTER — Encounter (HOSPITAL_BASED_OUTPATIENT_CLINIC_OR_DEPARTMENT_OTHER): Payer: Self-pay | Admitting: *Deleted

## 2017-03-15 ENCOUNTER — Emergency Department (HOSPITAL_BASED_OUTPATIENT_CLINIC_OR_DEPARTMENT_OTHER)
Admission: EM | Admit: 2017-03-15 | Discharge: 2017-03-15 | Disposition: A | Discharge status: Home / Self Care | Payer: Commercial Managed Care - PPO | Attending: Emergency Medicine | Admitting: Emergency Medicine

## 2017-03-15 DIAGNOSIS — E860 Dehydration: Secondary | ICD-10-CM | POA: Insufficient documentation

## 2017-03-15 DIAGNOSIS — I10 Essential (primary) hypertension: Secondary | ICD-10-CM | POA: Diagnosis not present

## 2017-03-15 DIAGNOSIS — E119 Type 2 diabetes mellitus without complications: Secondary | ICD-10-CM | POA: Diagnosis not present

## 2017-03-15 DIAGNOSIS — N179 Acute kidney failure, unspecified: Secondary | ICD-10-CM | POA: Diagnosis not present

## 2017-03-15 DIAGNOSIS — R112 Nausea with vomiting, unspecified: Secondary | ICD-10-CM | POA: Insufficient documentation

## 2017-03-15 DIAGNOSIS — Z794 Long term (current) use of insulin: Secondary | ICD-10-CM | POA: Diagnosis not present

## 2017-03-15 DIAGNOSIS — Z79899 Other long term (current) drug therapy: Secondary | ICD-10-CM | POA: Insufficient documentation

## 2017-03-15 DIAGNOSIS — R1110 Vomiting, unspecified: Secondary | ICD-10-CM | POA: Diagnosis present

## 2017-03-15 LAB — URINALYSIS, ROUTINE W REFLEX MICROSCOPIC
BILIRUBIN URINE: NEGATIVE
GLUCOSE, UA: 250 mg/dL — AB
Ketones, ur: 15 mg/dL — AB
Leukocytes, UA: NEGATIVE
Nitrite: NEGATIVE
PROTEIN: 30 mg/dL — AB
Specific Gravity, Urine: 1.017 (ref 1.005–1.030)
pH: 6.5 (ref 5.0–8.0)

## 2017-03-15 LAB — COMPREHENSIVE METABOLIC PANEL
ALBUMIN: 3.2 g/dL — AB (ref 3.5–5.0)
ALT: 37 U/L (ref 17–63)
AST: 33 U/L (ref 15–41)
Alkaline Phosphatase: 106 U/L (ref 38–126)
Anion gap: 13 (ref 5–15)
BUN: 34 mg/dL — AB (ref 6–20)
CO2: 25 mmol/L (ref 22–32)
CREATININE: 2.34 mg/dL — AB (ref 0.61–1.24)
Calcium: 9.7 mg/dL (ref 8.9–10.3)
Chloride: 95 mmol/L — ABNORMAL LOW (ref 101–111)
GFR calc Af Amer: 34 mL/min — ABNORMAL LOW (ref >60)
GFR calc non Af Amer: 29 mL/min — ABNORMAL LOW (ref >60)
Glucose, Bld: 271 mg/dL — ABNORMAL HIGH (ref 65–99)
Potassium: 4.1 mmol/L (ref 3.5–5.1)
SODIUM: 133 mmol/L — AB (ref 135–145)
Total Bilirubin: 0.5 mg/dL (ref 0.3–1.2)
Total Protein: 8.8 g/dL — ABNORMAL HIGH (ref 6.5–8.1)

## 2017-03-15 LAB — URINALYSIS, MICROSCOPIC (REFLEX)

## 2017-03-15 LAB — CBG MONITORING, ED: Glucose-Capillary: 263 mg/dL — ABNORMAL HIGH (ref 65–99)

## 2017-03-15 LAB — CBC
HCT: 34.1 % — ABNORMAL LOW (ref 39.0–52.0)
Hemoglobin: 11.4 g/dL — ABNORMAL LOW (ref 13.0–17.0)
MCH: 27.3 pg (ref 26.0–34.0)
MCHC: 33.4 g/dL (ref 30.0–36.0)
MCV: 81.8 fL (ref 78.0–100.0)
Platelets: 386 10*3/uL (ref 150–400)
RBC: 4.17 MIL/uL — ABNORMAL LOW (ref 4.22–5.81)
RDW: 13.1 % (ref 11.5–15.5)
WBC: 8.9 10*3/uL (ref 4.0–10.5)

## 2017-03-15 LAB — LIPASE, BLOOD: Lipase: 41 U/L (ref 11–51)

## 2017-03-15 MED ORDER — SODIUM CHLORIDE 0.9 % IV BOLUS (SEPSIS)
1000.0000 mL | Freq: Once | INTRAVENOUS | Status: AC
  Administered 2017-03-15: 1000 mL via INTRAVENOUS

## 2017-03-15 MED ORDER — SODIUM CHLORIDE 0.9 % IV BOLUS (SEPSIS): 1000.0000 mL | Freq: Once | INTRAVENOUS | Status: DC

## 2017-03-15 MED ORDER — ONDANSETRON HCL 4 MG/2ML IJ SOLN
4.0000 mg | Freq: Once | INTRAMUSCULAR | Status: AC | PRN
  Administered 2017-03-15: 4 mg via INTRAVENOUS
  Filled 2017-03-15: qty 2

## 2017-03-15 MED ORDER — DICYCLOMINE HCL 20 MG PO TABS: 20.0000 mg | ORAL_TABLET | Freq: Two times a day (BID) | ORAL | 0 refills | Status: AC

## 2017-03-15 MED ORDER — ONDANSETRON HCL 4 MG PO TABS: 4.0000 mg | ORAL_TABLET | Freq: Three times a day (TID) | ORAL | 0 refills | Status: AC | PRN

## 2017-03-15 MED FILL — ONDANSETRON HCL 4 MG TABLET: 4 | 3 days supply | Qty: 10 | Fill #0

## 2017-03-15 MED FILL — DICYCLOMINE 20 MG TABLET: 20 | 10 days supply | Qty: 20 | Fill #0

## 2017-03-15 NOTE — ED Notes (Signed)
Given ginger ale 

## 2017-03-15 NOTE — ED Notes (Signed)
Pa spoke to patient regarding admission.  Pt doesn't want to be admitted.

## 2017-03-15 NOTE — ED Triage Notes (Signed)
Vomiting x 3 days. Weakness. Diabetic. Epigastric pain x 2 days after having hick ups that would not go away.

## 2017-03-15 NOTE — ED Notes (Signed)
Discharge instructions reviewed.  Reviewed medications.  Pt states he is going to his doctor tomorrow.  Reaffirmed the importance of following up.  Reviewed with patient that he should return to ED if he wishes or worsens.

## 2017-03-15 NOTE — ED Provider Notes (Signed)
[facility] Provider Note   CSN: [id number] Arrival date & time: 03/15/17  1204     History   Chief Complaint Chief Complaint  Patient presents with  . Emesis  . Weakness    HPI Joel Mcdonald is a 56 y.o. male who presents emergency Department with chief complaint of vomiting. Patient states that 2 days ago he had onset of vomiting about an hour after eating a peanut butter and jelly sandwich. Patient states that he had about 7 total episodes of vomiting over the past 2 days. The vomiting was nonbloody, nonbilious. No associated abdominal pain or diarrhea. Patient states that he only sip water. He is able to hold some down yesterday and this morning had a couple bites of tomato soup and a saltine cracker, however, continued to have significant nausea and retching. He denies fever, chills, urinary symptoms. He states that he feels weak and dehydrated. He has never had anything like this before.  HPI  Past Medical History:  Diagnosis Date  . Diabetes mellitus without complication (HCC)   . Hypertension     Patient Active Problem List   Diagnosis Date Noted  . Benign essential hypertension 10/24/2015  . Hyperlipidemia 10/24/2015  . Chest pain 10/09/2014  . Nausea & vomiting 10/09/2014  . Hiccoughs 10/09/2014  . Uncontrolled type 2 diabetes mellitus with stage 3 chronic kidney disease, with long-term current use of insulin (HCC) 10/09/2014  . Sinus tachycardia 10/09/2014  . Hypertension 10/09/2014  . ARF (acute renal failure) (HCC) 10/09/2014    Past Surgical History:  Procedure Laterality Date  . TESTICLE TORSION REDUCTION         Home Medications    Prior to Admission medications   Medication Sig Start Date End Date Taking? Authorizing Provider  atorvastatin (LIPITOR) 40 MG tablet Take 40 mg by mouth every morning.    [provider]  BYDUREON 2 MG PEN  03/04/17   [provider]  carvedilol (COREG) 12.5 MG tablet  12/30/16   [provider]  Exenatide ER (BYDUREON) 2 MG SRER Inject 2 mg into the skin every Sunday.  06/28/14   [provider]  glimepiride (AMARYL) 4 MG tablet Take 4 mg by mouth 2 (two) times daily.    [provider]  Insulin Glargine (TOUJEO SOLOSTAR) 300 UNIT/ML SOPN INJECT 20 UNITS SUBCUTANEOUSLY ONCE DAILY 12/31/16   [provider]  insulin lispro (HUMALOG) 100 UNIT/ML injection Inject 76 Units into the skin continuous. 09/05/15   [provider]  losartan-hydrochlorothiazide Mauri Reading) 100-25 MG tablet  03/05/17   [provider]  metFORMIN (GLUCOPHAGE) 1000 MG tablet Take 1,000 mg by mouth daily with breakfast.     [provider]  ONE TOUCH ULTRA TEST test strip  12/30/16   [provider]  pioglitazone (ACTOS) 30 MG tablet Take one tab po daily 08/25/16   [provider]  sulfamethoxazole-trimethoprim (BACTRIM DS,SEPTRA DS) 800-160 MG tablet  03/08/17   [provider]  testosterone (ANDROGEL) 50 MG/5GM (1%) GEL Place 5 g onto the skin daily.    [provider]  valsartan-hydrochlorothiazide (DIOVAN-HCT) 320-25 MG tablet Take 1 tablet by mouth daily. 08/17/08   [provider]    Family History Family History  Problem Relation Age of Onset  . Stroke Mother   . Hypertension Father   . Diabetes Mellitus II Sister   . Heart attack Neg Hx     Social History Social History  Substance Use Topics  .  Smoking status: Never Smoker  . Smokeless tobacco: Never Used  . Alcohol use No     Allergies   Patient has no known allergies.   Review of Systems Review of Systems  Ten systems reviewed and are negative for acute change, except as noted in the HPI.   Physical Exam Updated Vital Signs BP (!) 177/96   Pulse 99   Temp 98.8 F (37.1 C) (Oral)   Resp (!) 8   Ht 6\' 1"  (1.854 m)   Wt 103.9 kg (229 lb)   SpO2 100%   BMI 30.21 kg/m   Physical Exam  Constitutional: He appears well-developed  and well-nourished. No distress.  HENT:  Head: Normocephalic and atraumatic.  Eyes: Conjunctivae are normal. No scleral icterus.  Neck: Normal range of motion. Neck supple.  Cardiovascular: Normal rate, regular rhythm and normal heart sounds.   Pulmonary/Chest: Effort normal and breath sounds normal. No respiratory distress.  Abdominal: Soft. There is no tenderness.  Musculoskeletal: He exhibits no edema.  Neurological: He is alert.  Skin: Skin is warm and dry. He is not diaphoretic.  Psychiatric: His behavior is normal.  Nursing note and vitals reviewed.    ED Treatments / Results  Labs (all labs ordered are listed, but only abnormal results are displayed) Labs Reviewed  COMPREHENSIVE METABOLIC PANEL - Abnormal; Notable for the following:       Result Value   Sodium 133 (*)    Chloride 95 (*)    Glucose, Bld 271 (*)    BUN 34 (*)    Creatinine, Ser 2.34 (*)    Total Protein 8.8 (*)    Albumin 3.2 (*)    GFR calc non Af Amer 29 (*)    GFR calc Af Amer 34 (*)    All other components within normal limits  CBC - Abnormal; Notable for the following:    RBC 4.17 (*)    Hemoglobin 11.4 (*)    HCT 34.1 (*)    All other components within normal limits  URINALYSIS, ROUTINE W REFLEX MICROSCOPIC - Abnormal; Notable for the following:    Glucose, UA 250 (*)    Hgb urine dipstick TRACE (*)    Ketones, ur 15 (*)    Protein, ur 30 (*)    All other components within normal limits  URINALYSIS, MICROSCOPIC (REFLEX) - Abnormal; Notable for the following:    Bacteria, UA MANY (*)    Squamous Epithelial / LPF 0-5 (*)    All other components within normal limits  CBG MONITORING, ED - Abnormal; Notable for the following:    Glucose-Capillary 263 (*)    All other components within normal limits  URINE CULTURE  LIPASE, BLOOD    EKG  EKG Interpretation  Date/Time:  Monday March 15 2017 12:31:09 EDT Ventricular Rate:  103 PR Interval:    QRS Duration: 92 QT Interval:  340 QTC  Calculation: 445 R Axis:   -25 Text Interpretation:  Sinus tachycardia Probable left atrial enlargement Borderline left axis deviation RSR' in V1 or V2, probably normal variant Confirmed by Tilden Fossa [phone]) on 03/15/2017 12:36:22 PM Also confirmed by Tilden Fossa [phone]), editor Misty Stanley [phone])  on 03/15/2017 1:38:47 PM       Radiology No results found.  Procedures Procedures (including critical care time)  Medications Ordered in ED Medications  sodium chloride 0.9 % bolus 1,000 mL (not administered)  sodium chloride 0.9 % bolus 1,000 mL (not administered)  ondansetron (ZOFRAN) injection 4 mg (4  mg Intravenous Given 03/15/17 1250)  sodium chloride 0.9 % bolus 1,000 mL (0 mLs Intravenous Stopped 03/15/17 1425)     Initial Impression / Assessment and Plan / ED Course  I have reviewed the triage vital signs and the nursing notes.  Pertinent labs & imaging results that were available during my care of the patient were reviewed by me and considered in my medical decision making (see chart for details).  Clinical Course as of Mar 15 1445  Mon Mar 15, 2017  1445 Creatinine: (!) 2.34 [AH]  1446 AKI BUN: (!) 34 [AH]    Clinical Course User Index [AH] Arthor Captain, PA-C      Final Clinical Impressions(s) / ED Diagnoses   Final diagnoses:  Dehydration  Non-intractable vomiting with nausea, unspecified vomiting type  Acute kidney injury (HCC)    Patient with dehydration, non-intractable vomiting. He is tolerating by mouth fluids and food. Dr. Madilyn Hook and I discussed his acute kidney injury and recommended inpatient admission with fluid rehydration. However, the patient declines admission, even though we've thoroughly reviewed risks. He is competent to make this decision. The patient has outpatient follow-up with his PCP in the next 2 days and have his labs reviewed checked. The patient has been 2 L of fluid, he is eating and drinking. He feels much improved. He  will be discharged with Zofran and Bentyl. He is to return immediately for any worsening in his condition. I discussed return precautions. I have reviewed medications with the pharmacist and will hold his metformin and by [facility] until he review was negative. His PCP.  New Prescriptions New Prescriptions   No medications on file     Arthor Captain, Cordelia Poche 03/15/17 1650    Tilden Fossa, MD 03/17/17 [phone]

## 2017-03-15 NOTE — Discharge Instructions (Signed)
DO NOT TAKE YOUR METFORMIN OR YOUR BYDUREON UNTIL YOU SEE YOUR PRIMARY CARE DOCTOR. MAKE SURE TO TAKE A COPY OF YOUR MEDICATIONS AND REVIEW THEM WITH YOUR PRIMARY CARE DOCTOR.  It is imperative that you follow up with her primary care physician to have your labs rechecked. We recommended that you stay in the hospital secondary to your kidney injury. Please return immediately to the emergency department if you continue to have vomiting or unable to hold down your foods and fluids.  Get help right away if: You have pain in your chest, neck, arm, or jaw. You feel extremely weak or you faint. You have persistent vomiting. You see blood in your vomit. Your vomit looks like black coffee grounds. You have bloody or black stools or stools that look like tar. You have a severe headache, a stiff neck, or both. You have a rash. You have severe pain, cramping, or bloating in your abdomen. You have trouble breathing or you are breathing very quickly. Your heart is beating very quickly. Your skin feels cold and clammy. You feel confused. You have pain when you urinate. You have signs of dehydration, such as: Dark urine, very little urine, or no urine. Cracked lips. Dry mouth. Sunken eyes. Sleepiness. Weakness.  Get help right away if: You develop symptoms of worsening kidney disease, which include: Headaches. Abnormally dark or light skin. Easy bruising. Frequent hiccups. Chest pain. Shortness of breath. End of menstruation in women. Seizures. Confusion or altered mental status. Abdominal or back pain. Itchiness. You have a fever. Your body is producing less urine. You have pain or bleeding when you urinate.

## 2017-03-16 LAB — URINE CULTURE

## 2017-03-17 ENCOUNTER — Ambulatory Visit (INDEPENDENT_AMBULATORY_CARE_PROVIDER_SITE_OTHER): Payer: Commercial Managed Care - PPO | Admitting: Podiatry

## 2017-03-17 DIAGNOSIS — M86271 Subacute osteomyelitis, right ankle and foot: Secondary | ICD-10-CM | POA: Diagnosis not present

## 2017-03-17 DIAGNOSIS — E0842 Diabetes mellitus due to underlying condition with diabetic polyneuropathy: Secondary | ICD-10-CM | POA: Diagnosis not present

## 2017-03-17 DIAGNOSIS — I70235 Atherosclerosis of native arteries of right leg with ulceration of other part of foot: Secondary | ICD-10-CM | POA: Diagnosis not present

## 2017-03-17 DIAGNOSIS — L97512 Non-pressure chronic ulcer of other part of right foot with fat layer exposed: Secondary | ICD-10-CM | POA: Diagnosis not present

## 2017-03-17 MED ORDER — AMOXICILLIN-POT CLAVULANATE 875-125 MG PO TABS: 1.0000 | ORAL_TABLET | Freq: Two times a day (BID) | ORAL | 0 refills | Status: DC

## 2017-03-17 NOTE — Patient Instructions (Signed)
Pre-Operative Instructions  Congratulations, you have decided to take an important step towards improving your quality of life.  You can be assured that the doctors and staff at [facility] will be with you every step of the way.  Here are some important things you should know:  1. Plan to be at the surgery center/hospital at least 1 (one) hour prior to your scheduled time, unless otherwise directed by the surgical center/hospital staff.  You must have a responsible adult accompany you, remain during the surgery and drive you home.  Make sure you have directions to the surgical center/hospital to ensure you arrive on time. 2. If you are having surgery at [facility] or [facility], you will need a copy of your medical history and physical form from your family physician within one month prior to the date of surgery. We will give you a form for your primary physician to complete.  3. We make every effort to accommodate the date you request for surgery.  However, there are times where surgery dates or times have to be moved.  We will contact you as soon as possible if a change in schedule is required.   4. No aspirin/ibuprofen for one week before surgery.  If you are on aspirin, any non-steroidal anti-inflammatory medications (Mobic, Aleve, Ibuprofen) should not be taken seven (7) days prior to your surgery.  You make take Tylenol for pain prior to surgery.  5. Medications - If you are taking daily heart and blood pressure medications, seizure, reflux, allergy, asthma, anxiety, pain or diabetes medications, make sure you notify the surgery center/hospital before the day of surgery so they can tell you which medications you should take or avoid the day of surgery. 6. No food or drink after midnight the night before surgery unless directed otherwise by surgical center/hospital staff. 7. No alcoholic beverages 24-hours prior to surgery.  No smoking 24-hours prior or 24-hours after  surgery. 8. Wear loose pants or shorts. They should be loose enough to fit over bandages, boots, and casts. 9. Don't wear slip-on shoes. Sneakers are preferred. 10. Bring your boot with you to the surgery center/hospital.  Also bring crutches or a walker if your physician has prescribed it for you.  If you do not have this equipment, it will be provided for you after surgery. 11. If you have not been contacted by the surgery center/hospital by the day before your surgery, call to confirm the date and time of your surgery. 12. Leave-time from work may vary depending on the type of surgery you have.  Appropriate arrangements should be made prior to surgery with your employer. 13. Prescriptions will be provided immediately following surgery by your doctor.  Fill these as soon as possible after surgery and take the medication as directed. Pain medications will not be refilled on weekends and must be approved by the doctor. 14. Remove nail polish on the operative foot and avoid getting pedicures prior to surgery. 15. Wash the night before surgery.  The night before surgery wash the foot and leg well with water and the antibacterial soap provided. Be sure to pay special attention to beneath the toenails and in between the toes.  Wash for at least three (3) minutes. Rinse thoroughly with water and dry well with a towel.  Perform this wash unless told not to do so by your physician.  Enclosed: 1 Ice pack (please put in freezer the night before surgery)   1 Hibiclens skin cleaner     Pre-op instructions  If you have any questions regarding the instructions, please do not hesitate to call our office.  [place]: [address], [place], [place] [address] -- [phone]  [place]: [address] [place], [place] [address] -- [phone]  [place]: [address]  [place], [place] [address] -- [phone]  [place]: [address], [address], [place], [place] [address] -- [phone]  Website:  [email or web address] 

## 2017-03-17 NOTE — Progress Notes (Signed)
   Subjective:  56 year old male with a history of uncontrolled diabetes mellitus presents today for evaluation ulcer to the plantar aspect of the right foot sub-second MPJ. Patient states that approximately 5-10 years ago he kicked a wall dislocated second toe. He slowly eventually developed a callus to the sub-second MPJ with ulceration over the past several months.   patient was last seen on 03/10/2017 at which time MRI was ordered to determine the presence of osteomyelitis. Patient presents today for follow-up treatment and evaluation and to discuss possible surgical intervention   Objective/Physical Exam General: The patient is alert and oriented x3 in no acute distress.  Dermatology:  Wound #1 noted to the plantar aspect of the sub-second MPJ right forefoot measuring approximately 1.01.0x3.0 cm (LxWxD).   To the noted ulceration(s), there is no eschar. There is a moderate amount of slough, fibrin, and necrotic tissue noted. Granulation tissue and wound base is red. There is a heavy amount of serosanguineous drainage noted. The ulceration does probe to bone and joint capsule. There is a somewhat strong malodor noted. Periwound integrity is significantly callused. Skin is warm, dry and supple bilateral lower extremities.  Vascular:  Neurovascular status intact.Palpable pedal pulses bilaterally. Moderate edema with erythema noted localized second digit of the right foot. Capillary refill within normal limits.  Neurological: Epicritic and protective threshold absent bilaterally.   Musculoskeletal Exam: Range of motion within normal limits to all pedal and ankle joints bilateral. Muscle strength 5/5 in all groups bilateral.   Assessment: #1 right sub-second MPJ ulcer with cellulitis secondary to diabetes mellitus #2 diabetes mellitus w/ peripheral neuropathy #3  Osteomyelitis second metatarsal right foot   Plan of Care:  #1 Patient was evaluated. #2  Today we discussed surgical  intervention regarding osteomyelitis. Partial second ray amputation was warranted based on MRI findings. Cellulitis and edema appears to be localized to the mostly to the second digit right foot. All possible complications and details the procedure were explained. No guarantees were expressed or implied. All patient questions answered.  #3 authorization for surgery initiated today. Surgery will consist of partial second ray amputation right foot. Incision and drainage right foot  #4 prescription for Augmentin 2 weeks  #5  Postoperative shoe dispensed today  #6 return to clinic 1 week postop   patient recently was laid off his job.    Felecia Shelling, DPM [facility]  Dr. Felecia Shelling, DPM    [address]                                        [place], [place] [address]                Office [phone]  Fax [phone]

## 2017-03-23 ENCOUNTER — Telehealth: Payer: Self-pay | Admitting: *Deleted

## 2017-03-23 NOTE — Telephone Encounter (Signed)
I'm calling you in regards to your insurance.  I checked your UMR and it is showing that your insurance is not active at this time.  Do you have another insurance?  "No, I lost my job."  Do you want to cancel the surgery or reschedule it?  "No, I guess I'll make payment arrangements."  You're going to need to speak to Jocelyn Lamer in [employer] department about payment arrangements.  I will get her to give you a call tomorrow.  You'll also need to call the surgical center to see how much money you will have to pay them the morning of surgery.  You will also have an anesthesia fee.  So you need to call them as well.  (Amputation Toe Met w/ toe 2nd rt)--- 45 minutes  Please let me know if I need to cancel surgery or reschedule surgery.

## 2017-03-24 ENCOUNTER — Encounter: Payer: Commercial Managed Care - PPO | Admitting: Podiatry

## 2017-03-24 ENCOUNTER — Telehealth: Payer: Self-pay | Admitting: *Deleted

## 2017-03-24 NOTE — Telephone Encounter (Signed)
According to Chip BoerVicki in insurance, patient paid half of the physician fee for surgery today.

## 2017-03-24 NOTE — Telephone Encounter (Signed)
[facility]cd[facility] last wound cultures. Faxed to [facility].

## 2017-03-25 ENCOUNTER — Encounter: Payer: Self-pay | Admitting: Podiatry

## 2017-03-25 DIAGNOSIS — M86671 Other chronic osteomyelitis, right ankle and foot: Secondary | ICD-10-CM

## 2017-03-26 ENCOUNTER — Telehealth: Payer: Self-pay

## 2017-03-26 NOTE — Telephone Encounter (Signed)
LVM regarding post op status 

## 2017-03-31 ENCOUNTER — Encounter: Payer: Self-pay | Admitting: Podiatry

## 2017-03-31 ENCOUNTER — Ambulatory Visit (INDEPENDENT_AMBULATORY_CARE_PROVIDER_SITE_OTHER): Payer: Commercial Managed Care - PPO | Admitting: Podiatry

## 2017-03-31 ENCOUNTER — Ambulatory Visit (INDEPENDENT_AMBULATORY_CARE_PROVIDER_SITE_OTHER): Payer: Self-pay

## 2017-03-31 DIAGNOSIS — Z9889 Other specified postprocedural states: Secondary | ICD-10-CM

## 2017-03-31 MED ORDER — AMOXICILLIN-POT CLAVULANATE 875-125 MG PO TABS: 1.0000 | ORAL_TABLET | Freq: Two times a day (BID) | ORAL | 0 refills | Status: DC

## 2017-04-02 NOTE — Progress Notes (Signed)
   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 DOS: 03/25/17.    Past Medical History:  Diagnosis Date  . Diabetes mellitus without complication (HCC)   . Hypertension     Objective/Physical Exam Skin incisions appear to be well coapted with sutures and staples intact. No sign of infectious process noted. No dehiscence. No active bleeding noted. Moderate edema noted to the surgical extremity.  Radiographic Exam:  Osteotomies sites appear to be stable with routine healing.  Assessment: 1. 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 DOS: 03/25/17   Plan of Care:  1. Patient was evaluated. X-rays reviewed 2. Dressing changed.  3. Refill prescription for Augmentin given to patient. 4. Keep dressings CDI. 5. Continue post op shoe. 6. Return to clinic in 1 week.   Felecia Shelling, DPM [facility]  Dr. Felecia Shelling, DPM    [address] [place] [address]                                        [place], [place] [address]                Office [phone]  Fax [phone]

## 2017-04-07 ENCOUNTER — Encounter: Payer: Self-pay | Admitting: Podiatry

## 2017-04-07 ENCOUNTER — Ambulatory Visit (INDEPENDENT_AMBULATORY_CARE_PROVIDER_SITE_OTHER): Payer: Self-pay | Admitting: Podiatry

## 2017-04-07 VITALS — BP 124/82 | HR 107 | Temp 98.0°F

## 2017-04-07 DIAGNOSIS — Z9889 Other specified postprocedural states: Secondary | ICD-10-CM

## 2017-04-11 NOTE — Progress Notes (Signed)
   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 DOS: 03/25/17. He states he is doing well and has no new complaints at this time. He is here for further evaluation and treatment.   Past Medical History:  Diagnosis Date  . Diabetes mellitus without complication (HCC)   . Hypertension     Objective/Physical Exam Skin incisions appear to be well coapted with sutures and staples intact. No sign of infectious process noted. No dehiscence. No active bleeding noted. Moderate edema noted to the surgical extremity.  Assessment: 1. 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 DOS: 03/25/17   Plan of Care:  1. Patient was evaluated.  2. Dressing changed.  3. Continue taking Augmentin antibiotics. 4. Dressing supplies provided for patient. 5. Return to clinic in 1 week for suture removal.   Felecia Shelling, DPM [facility]  Dr. Felecia Shelling, DPM    [address]                                        [place], [place] [address]                Office [phone]  Fax [phone]

## 2017-04-14 ENCOUNTER — Ambulatory Visit (INDEPENDENT_AMBULATORY_CARE_PROVIDER_SITE_OTHER): Payer: Self-pay | Admitting: Podiatry

## 2017-04-14 ENCOUNTER — Encounter: Payer: Self-pay | Admitting: Podiatry

## 2017-04-14 VITALS — BP 146/92 | Temp 98.9°F

## 2017-04-14 DIAGNOSIS — Z9889 Other specified postprocedural states: Secondary | ICD-10-CM

## 2017-04-15 NOTE — Progress Notes (Signed)
   Subjective:  Patient with PMHx of DM presents today status post partial 2nd ray amputation right foot. DOS: 03/25/17. He states he is doing well and has no new complaints at this time. He is here for further evaluation and treatment.    Past Medical History:  Diagnosis Date  . Diabetes mellitus without complication (HCC)   . Hypertension     Objective/Physical Exam Skin incisions appear to be well coapted with sutures and staples intact. No sign of infectious process noted. No dehiscence. No active bleeding noted. Moderate edema noted to the surgical extremity.  Assessment: 1. 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 DOS: 03/25/17   Plan of Care:  1. Patient was evaluated.  2. Sutures removed today. Dry sterile dressing applied. 3. Begin showering and dress with antibiotic ointment and Band-Aid daily. 4. Return to normal shoe gear. 5. Return to clinic in 4 weeks.   Felecia Shelling, DPM [facility]  Dr. Felecia Shelling, DPM    [address]                                        [place], [place] [address]                Office [phone]  Fax [phone]

## 2017-04-27 NOTE — Progress Notes (Signed)
DOS 9.6.18 Partial 2nd ray amp Rt foot, I&D Rt foot

## 2017-05-12 ENCOUNTER — Encounter: Payer: Self-pay | Admitting: Podiatry

## 2017-05-12 ENCOUNTER — Ambulatory Visit (INDEPENDENT_AMBULATORY_CARE_PROVIDER_SITE_OTHER): Payer: Self-pay | Admitting: Podiatry

## 2017-05-12 DIAGNOSIS — Z9889 Other specified postprocedural states: Secondary | ICD-10-CM

## 2017-05-15 NOTE — Progress Notes (Signed)
   Subjective:  Patient with PMHx of DM presents today status post partial 2nd ray amputation right foot. DOS: 03/25/17. He states he is doing well and feels fine. He denies any issues at this time. He is here for further evaluation and treatment.    Past Medical History:  Diagnosis Date  . Diabetes mellitus without complication (HCC)   . Hypertension     Objective/Physical Exam Skin incisions appear to be healed. No sign of infectious process noted. No dehiscence. No active bleeding noted. Moderate edema noted to the surgical extremity.  Assessment: 1. 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 DOS: 03/25/17   Plan of Care:  1. Patient was evaluated.  2. Recommended good shoe gear. 3. Advised patient regarding the importance of controlling blood sugar levels.  4. Return to clinic when necessary.   Felecia ShellingBrent M. Tishanna Dunford, DPM [facility]  Dr. Felecia ShellingBrent M. Derico Mitton, DPM    [address]                                        [place], [place] [address]                Office [phone]  Fax [phone]

## 2017-09-28 ENCOUNTER — Encounter (HOSPITAL_BASED_OUTPATIENT_CLINIC_OR_DEPARTMENT_OTHER): Payer: Self-pay | Admitting: *Deleted

## 2017-09-28 ENCOUNTER — Inpatient Hospital Stay (HOSPITAL_BASED_OUTPATIENT_CLINIC_OR_DEPARTMENT_OTHER)
Admission: EM | Admit: 2017-09-28 | Discharge: 2017-09-30 | DRG: 638 | Disposition: A | Discharge status: Home / Self Care | Payer: Self-pay | Attending: Internal Medicine | Admitting: Internal Medicine

## 2017-09-28 DIAGNOSIS — R112 Nausea with vomiting, unspecified: Secondary | ICD-10-CM | POA: Diagnosis present

## 2017-09-28 DIAGNOSIS — G43A1 Cyclical vomiting, intractable: Secondary | ICD-10-CM

## 2017-09-28 DIAGNOSIS — E1122 Type 2 diabetes mellitus with diabetic chronic kidney disease: Secondary | ICD-10-CM | POA: Diagnosis present

## 2017-09-28 DIAGNOSIS — N183 Chronic kidney disease, stage 3 unspecified: Secondary | ICD-10-CM | POA: Diagnosis present

## 2017-09-28 DIAGNOSIS — Z9114 Patient's other noncompliance with medication regimen: Secondary | ICD-10-CM

## 2017-09-28 DIAGNOSIS — Z794 Long term (current) use of insulin: Secondary | ICD-10-CM

## 2017-09-28 DIAGNOSIS — I10 Essential (primary) hypertension: Secondary | ICD-10-CM | POA: Diagnosis present

## 2017-09-28 DIAGNOSIS — I129 Hypertensive chronic kidney disease with stage 1 through stage 4 chronic kidney disease, or unspecified chronic kidney disease: Secondary | ICD-10-CM | POA: Diagnosis present

## 2017-09-28 DIAGNOSIS — E1110 Type 2 diabetes mellitus with ketoacidosis without coma: Principal | ICD-10-CM | POA: Diagnosis present

## 2017-09-28 DIAGNOSIS — E860 Dehydration: Secondary | ICD-10-CM | POA: Diagnosis present

## 2017-09-28 DIAGNOSIS — I160 Hypertensive urgency: Secondary | ICD-10-CM | POA: Diagnosis present

## 2017-09-28 DIAGNOSIS — Z79899 Other long term (current) drug therapy: Secondary | ICD-10-CM

## 2017-09-28 DIAGNOSIS — N179 Acute kidney failure, unspecified: Secondary | ICD-10-CM | POA: Diagnosis present

## 2017-09-28 DIAGNOSIS — Z9119 Patient's noncompliance with other medical treatment and regimen: Secondary | ICD-10-CM

## 2017-09-28 LAB — BASIC METABOLIC PANEL
ANION GAP: 11 (ref 5–15)
Anion gap: 21 — ABNORMAL HIGH (ref 5–15)
BUN: 30 mg/dL — ABNORMAL HIGH (ref 6–20)
BUN: 19 mg/dL (ref 6–20)
CALCIUM: 9.4 mg/dL (ref 8.9–10.3)
CALCIUM: 9.1 mg/dL (ref 8.9–10.3)
CO2: 14 mmol/L — AB (ref 22–32)
CO2: 21 mmol/L — ABNORMAL LOW (ref 22–32)
CREATININE: 1.93 mg/dL — AB (ref 0.61–1.24)
Chloride: 101 mmol/L (ref 101–111)
Chloride: 105 mmol/L (ref 101–111)
Creatinine, Ser: 1.31 mg/dL — ABNORMAL HIGH (ref 0.61–1.24)
GFR calc non Af Amer: 37 mL/min — ABNORMAL LOW (ref >60)
GFR, EST AFRICAN AMERICAN: 43 mL/min — AB (ref >60)
GFR, EST NON AFRICAN AMERICAN: 59 mL/min — AB (ref >60)
GLUCOSE: 335 mg/dL — AB (ref 65–99)
GLUCOSE: 316 mg/dL — AB (ref 65–99)
POTASSIUM: 3.6 mmol/L (ref 3.5–5.1)
Potassium: 4.7 mmol/L (ref 3.5–5.1)
Sodium: 136 mmol/L (ref 135–145)
Sodium: 137 mmol/L (ref 135–145)

## 2017-09-28 LAB — URINALYSIS, ROUTINE W REFLEX MICROSCOPIC
Glucose, UA: >=500 mg/dL — AB
Ketones, ur: >80 mg/dL — AB
Leukocytes, UA: NEGATIVE
NITRITE: NEGATIVE
PH: 5.5 (ref 5.0–8.0)
Protein, ur: 30 mg/dL — AB
SPECIFIC GRAVITY, URINE: 1.015 (ref 1.005–1.030)

## 2017-09-28 LAB — COMPREHENSIVE METABOLIC PANEL
ALBUMIN: 3.9 g/dL (ref 3.5–5.0)
ALBUMIN: 3.3 g/dL — AB (ref 3.5–5.0)
ALK PHOS: 62 U/L (ref 38–126)
ALT: 16 U/L — ABNORMAL LOW (ref 17–63)
ALT: 13 U/L — ABNORMAL LOW (ref 17–63)
ANION GAP: 26 — AB (ref 5–15)
AST: 17 U/L (ref 15–41)
AST: 15 U/L (ref 15–41)
Alkaline Phosphatase: 75 U/L (ref 38–126)
Anion gap: 9 (ref 5–15)
BILIRUBIN TOTAL: 0.8 mg/dL (ref 0.3–1.2)
BUN: 34 mg/dL — ABNORMAL HIGH (ref 6–20)
BUN: 24 mg/dL — ABNORMAL HIGH (ref 6–20)
CALCIUM: 9 mg/dL (ref 8.9–10.3)
CHLORIDE: 92 mmol/L — AB (ref 101–111)
CO2: 12 mmol/L — AB (ref 22–32)
CO2: 23 mmol/L (ref 22–32)
CREATININE: 1.42 mg/dL — AB (ref 0.61–1.24)
Calcium: 9.8 mg/dL (ref 8.9–10.3)
Chloride: 106 mmol/L (ref 101–111)
Creatinine, Ser: 2.2 mg/dL — ABNORMAL HIGH (ref 0.61–1.24)
GFR calc Af Amer: 37 mL/min — ABNORMAL LOW (ref >60)
GFR calc Af Amer: >60 mL/min (ref >60)
GFR calc non Af Amer: 32 mL/min — ABNORMAL LOW (ref >60)
GFR calc non Af Amer: 54 mL/min — ABNORMAL LOW (ref >60)
GLUCOSE: 508 mg/dL — AB (ref 65–99)
GLUCOSE: 151 mg/dL — AB (ref 65–99)
POTASSIUM: 4.7 mmol/L (ref 3.5–5.1)
Potassium: 3.5 mmol/L (ref 3.5–5.1)
SODIUM: 130 mmol/L — AB (ref 135–145)
SODIUM: 138 mmol/L (ref 135–145)
Total Bilirubin: 1.6 mg/dL — ABNORMAL HIGH (ref 0.3–1.2)
Total Protein: 8.4 g/dL — ABNORMAL HIGH (ref 6.5–8.1)
Total Protein: 7.4 g/dL (ref 6.5–8.1)

## 2017-09-28 LAB — CBC
HCT: 37.8 % — ABNORMAL LOW (ref 39.0–52.0)
HEMATOCRIT: 43.1 % (ref 39.0–52.0)
HEMOGLOBIN: 14.8 g/dL (ref 13.0–17.0)
Hemoglobin: 13 g/dL (ref 13.0–17.0)
MCH: 27.5 pg (ref 26.0–34.0)
MCH: 28 pg (ref 26.0–34.0)
MCHC: 34.3 g/dL (ref 30.0–36.0)
MCHC: 34.4 g/dL (ref 30.0–36.0)
MCV: 80.1 fL (ref 78.0–100.0)
MCV: 81.3 fL (ref 78.0–100.0)
Platelets: 260 10*3/uL (ref 150–400)
Platelets: 208 10*3/uL (ref 150–400)
RBC: 5.38 MIL/uL (ref 4.22–5.81)
RBC: 4.65 MIL/uL (ref 4.22–5.81)
RDW: 13.4 % (ref 11.5–15.5)
RDW: 13.2 % (ref 11.5–15.5)
WBC: 8.3 10*3/uL (ref 4.0–10.5)
WBC: 8.5 10*3/uL (ref 4.0–10.5)

## 2017-09-28 LAB — CBG MONITORING, ED
GLUCOSE-CAPILLARY: 403 mg/dL — AB (ref 65–99)
GLUCOSE-CAPILLARY: 266 mg/dL — AB (ref 65–99)
GLUCOSE-CAPILLARY: 193 mg/dL — AB (ref 65–99)
GLUCOSE-CAPILLARY: 144 mg/dL — AB (ref 65–99)
GLUCOSE-CAPILLARY: 122 mg/dL — AB (ref 65–99)
Glucose-Capillary: 376 mg/dL — ABNORMAL HIGH (ref 65–99)
Glucose-Capillary: 177 mg/dL — ABNORMAL HIGH (ref 65–99)
Glucose-Capillary: 221 mg/dL — ABNORMAL HIGH (ref 65–99)
Glucose-Capillary: 138 mg/dL — ABNORMAL HIGH (ref 65–99)
Glucose-Capillary: 141 mg/dL — ABNORMAL HIGH (ref 65–99)
Glucose-Capillary: 197 mg/dL — ABNORMAL HIGH (ref 65–99)
Glucose-Capillary: 134 mg/dL — ABNORMAL HIGH (ref 65–99)
Glucose-Capillary: 142 mg/dL — ABNORMAL HIGH (ref 65–99)
Glucose-Capillary: 136 mg/dL — ABNORMAL HIGH (ref 65–99)
Glucose-Capillary: 168 mg/dL — ABNORMAL HIGH (ref 65–99)

## 2017-09-28 LAB — GLUCOSE, CAPILLARY
GLUCOSE-CAPILLARY: 305 mg/dL — AB (ref 65–99)
Glucose-Capillary: 193 mg/dL — ABNORMAL HIGH (ref 65–99)
Glucose-Capillary: 213 mg/dL — ABNORMAL HIGH (ref 65–99)

## 2017-09-28 LAB — TROPONIN I: Troponin I: <0.03 ng/mL (ref <0.03)

## 2017-09-28 LAB — URINALYSIS, MICROSCOPIC (REFLEX)

## 2017-09-28 LAB — LIPASE, BLOOD: Lipase: 44 U/L (ref 11–51)

## 2017-09-28 MED ORDER — ACETAMINOPHEN 650 MG RE SUPP: 650.0000 mg | Freq: Four times a day (QID) | RECTAL | Status: DC | PRN

## 2017-09-28 MED ORDER — LORAZEPAM 2 MG/ML IJ SOLN
1.0000 mg | Freq: Once | INTRAMUSCULAR | Status: AC
  Administered 2017-09-28: 1 mg via INTRAVENOUS
  Filled 2017-09-28: qty 1

## 2017-09-28 MED ORDER — METOCLOPRAMIDE HCL 10 MG PO TABS
10.0000 mg | ORAL_TABLET | Freq: Three times a day (TID) | ORAL | Status: DC | PRN
  Administered 2017-09-28: 10 mg via ORAL
  Filled 2017-09-28: qty 1

## 2017-09-28 MED ORDER — ONDANSETRON HCL 4 MG PO TABS: 4.0000 mg | ORAL_TABLET | Freq: Four times a day (QID) | ORAL | Status: DC | PRN

## 2017-09-28 MED ORDER — DEXTROSE-NACL 5-0.45 % IV SOLN
INTRAVENOUS | Status: DC
  Administered 2017-09-28: 11:00:00 via INTRAVENOUS

## 2017-09-28 MED ORDER — POTASSIUM CHLORIDE 10 MEQ/100ML IV SOLN
10.0000 meq | INTRAVENOUS | Status: AC
  Administered 2017-09-28 (×2): 10 meq via INTRAVENOUS
  Filled 2017-09-28: qty 100

## 2017-09-28 MED ORDER — LACTATED RINGERS IV SOLN
INTRAVENOUS | Status: DC
  Administered 2017-09-28 – 2017-09-29 (×2): via INTRAVENOUS

## 2017-09-28 MED ORDER — ONDANSETRON HCL 4 MG/2ML IJ SOLN: 4.0000 mg | Freq: Four times a day (QID) | INTRAMUSCULAR | Status: DC | PRN

## 2017-09-28 MED ORDER — SODIUM CHLORIDE 0.9 % IV BOLUS (SEPSIS)
2000.0000 mL | Freq: Once | INTRAVENOUS | Status: AC
  Administered 2017-09-28: 2000 mL via INTRAVENOUS

## 2017-09-28 MED ORDER — ONDANSETRON 4 MG PO TBDP
ORAL_TABLET | ORAL | Status: AC
  Administered 2017-09-28: 4 mg via ORAL
  Filled 2017-09-28: qty 1

## 2017-09-28 MED ORDER — ACETAMINOPHEN 325 MG PO TABS: 650.0000 mg | ORAL_TABLET | Freq: Four times a day (QID) | ORAL | Status: DC | PRN

## 2017-09-28 MED ORDER — INSULIN GLARGINE 100 UNIT/ML ~~LOC~~ SOLN
15.0000 [IU] | Freq: Every day | SUBCUTANEOUS | Status: DC
  Administered 2017-09-28: 15 [IU] via SUBCUTANEOUS
  Filled 2017-09-28: qty 0.15

## 2017-09-28 MED ORDER — ENOXAPARIN SODIUM 40 MG/0.4ML ~~LOC~~ SOLN
40.0000 mg | Freq: Every day | SUBCUTANEOUS | Status: DC
  Administered 2017-09-29 (×2): 40 mg via SUBCUTANEOUS
  Filled 2017-09-28 (×2): qty 0.4

## 2017-09-28 MED ORDER — SODIUM CHLORIDE 0.9 % IV SOLN
INTRAVENOUS | Status: DC
  Administered 2017-09-28: 4.5 [IU]/h via INTRAVENOUS
  Filled 2017-09-28 (×2): qty 1

## 2017-09-28 MED ORDER — INSULIN ASPART 100 UNIT/ML ~~LOC~~ SOLN: 0.0000 [IU] | Freq: Three times a day (TID) | SUBCUTANEOUS | Status: DC

## 2017-09-28 MED ORDER — METOCLOPRAMIDE HCL 5 MG/ML IJ SOLN
10.0000 mg | Freq: Once | INTRAMUSCULAR | Status: AC
  Administered 2017-09-28: 10 mg via INTRAVENOUS
  Filled 2017-09-28: qty 2

## 2017-09-28 MED ORDER — ONDANSETRON 4 MG PO TBDP
4.0000 mg | ORAL_TABLET | Freq: Once | ORAL | Status: AC | PRN
  Administered 2017-09-28: 4 mg via ORAL

## 2017-09-28 MED ORDER — HYDRALAZINE HCL 20 MG/ML IJ SOLN: 10.0000 mg | INTRAMUSCULAR | Status: DC | PRN

## 2017-09-28 MED ORDER — DIPHENHYDRAMINE HCL 50 MG/ML IJ SOLN
25.0000 mg | Freq: Once | INTRAMUSCULAR | Status: AC
  Administered 2017-09-28: 25 mg via INTRAVENOUS
  Filled 2017-09-28: qty 1

## 2017-09-28 NOTE — ED Notes (Signed)
Paged admitting MD for status update

## 2017-09-28 NOTE — ED Notes (Signed)
Multiple staff tried to draw blood with no success, EDP notified

## 2017-09-28 NOTE — ED Triage Notes (Signed)
Pt reports onset of vomiting last Tuesday. Sates he has been unable to eat, vomiting usually starts at night. Epigastric pain. Vomiting in triage, no diarrhea.

## 2017-09-28 NOTE — ED Notes (Signed)
Hospitalist returned call and no orders received

## 2017-09-28 NOTE — ED Notes (Signed)
Dr. Adela LankFloyd aware that pt has a blood sugar of 508 and Anion Gap of 26.

## 2017-09-28 NOTE — ED Notes (Signed)
Right brachial artery draw for venous labs, tol. Well, pressure x .

## 2017-09-28 NOTE — ED Notes (Signed)
BMET recollect attempt from peripheral stick. Right forearm x1, RN aware.

## 2017-09-28 NOTE — ED Notes (Signed)
IV attempt unsuccessful x 2, RT attempting IV ultrasound at this time

## 2017-09-28 NOTE — ED Provider Notes (Signed)
[facility] EMERGENCY DEPARTMENT Provider Note   CSN: [id number] Arrival date & time: 09/28/17  0039     History   Chief Complaint Chief Complaint  Patient presents with  . Emesis    HPI Joel Mcdonald is a 57 y.o. male.  57 yo M with a significant past medical history of type 2 diabetes on insulin comes in with a chief complaint of weakness and emesis.  This been going on for the past week or so.  Patient has not been able to afford his insulin and so has not taken it for the past 6 months or so.  He denies chest pain or shortness of breath.  Denies abdominal pain or fever.  Denies diarrhea.  He is not had this condition as far as he knows.   The history is provided by the patient.  Emesis   This is a new problem. The current episode started more than 2 days ago. The problem occurs 2 to 4 times per day. The problem has not changed since onset.The emesis has an appearance of stomach contents. There has been no fever. Pertinent negatives include no abdominal pain, no arthralgias, no chills, no diarrhea, no fever, no headaches and no myalgias.    Past Medical History:  Diagnosis Date  . Diabetes mellitus without complication (HCC)   . Hypertension     Patient Active Problem List   Diagnosis Date Noted  . DKA (diabetic ketoacidoses) (HCC) 09/28/2017  . Benign essential hypertension 10/24/2015  . Hyperlipidemia 10/24/2015  . Chest pain 10/09/2014  . Nausea & vomiting 10/09/2014  . Hiccoughs 10/09/2014  . Uncontrolled type 2 diabetes mellitus with stage 3 chronic kidney disease, with long-term current use of insulin (HCC) 10/09/2014  . Sinus tachycardia 10/09/2014  . Hypertension 10/09/2014  . ARF (acute renal failure) (HCC) 10/09/2014    Past Surgical History:  Procedure Laterality Date  . TESTICLE TORSION REDUCTION         Home Medications    Prior to Admission medications   Medication Sig Start Date End Date Taking? Authorizing Provider    amoxicillin-clavulanate (AUGMENTIN) 875-125 MG tablet Take 1 tablet by mouth 2 (two) times daily. 03/31/17   Felecia Shelling, DPM  atorvastatin (LIPITOR) 40 MG tablet Take 40 mg by mouth every morning.    [provider]  BYDUREON 2 MG PEN  03/04/17   [provider]  carvedilol (COREG) 12.5 MG tablet  12/30/16   [provider]  dicyclomine (BENTYL) 20 MG tablet Take 1 tablet (20 mg total) by mouth 2 (two) times daily. 03/15/17   Arthor Captain, PA-C  Exenatide ER (BYDUREON) 2 MG SRER Inject 2 mg into the skin every Sunday.  06/28/14   [provider]  glimepiride (AMARYL) 4 MG tablet Take 4 mg by mouth 2 (two) times daily.    [provider]  Insulin Glargine (TOUJEO SOLOSTAR) 300 UNIT/ML SOPN INJECT 20 UNITS SUBCUTANEOUSLY ONCE DAILY 12/31/16   [provider]  insulin lispro (HUMALOG) 100 UNIT/ML injection Inject 76 Units into the skin continuous. 09/05/15   [provider]  losartan-hydrochlorothiazide Mauri Reading) 100-25 MG tablet  03/05/17   [provider]  metFORMIN (GLUCOPHAGE) 1000 MG tablet Take 1,000 mg by mouth daily with breakfast.     [provider]  ondansetron (ZOFRAN) 4 MG tablet Take 1 tablet (4 mg total) by mouth every 8 (eight) hours as needed for nausea or vomiting. 03/15/17   Arthor Captain, PA-C  ONE [facility]  ULTRA TEST test strip  12/30/16   [provider]  pioglitazone (ACTOS) 30 MG tablet Take one tab po daily 08/25/16   [provider]  sulfamethoxazole-trimethoprim (BACTRIM DS,SEPTRA DS) 800-160 MG tablet  03/08/17   [provider]  testosterone (ANDROGEL) 50 MG/5GM (1%) GEL Place 5 g onto the skin daily.    [provider]  valsartan-hydrochlorothiazide (DIOVAN-HCT) 320-25 MG tablet Take 1 tablet by mouth daily. 08/17/08   [provider]    Family History Family History  Problem Relation Age of Onset  . Stroke Mother   . Hypertension Father   .  Diabetes Mellitus II Sister   . Heart attack Neg Hx     Social History Social History   Tobacco Use  . Smoking status: Never Smoker  . Smokeless tobacco: Never Used  Substance Use Topics  . Alcohol use: No  . Drug use: No     Allergies   Patient has no known allergies.   Review of Systems Review of Systems  Constitutional: Negative for chills and fever.  HENT: Negative for congestion and facial swelling.   Eyes: Negative for discharge and visual disturbance.  Respiratory: Negative for shortness of breath.   Cardiovascular: Negative for chest pain and palpitations.  Gastrointestinal: Positive for nausea and vomiting. Negative for abdominal pain and diarrhea.  Musculoskeletal: Negative for arthralgias and myalgias.  Skin: Negative for color change and rash.  Neurological: Positive for weakness. Negative for tremors, syncope and headaches.  Psychiatric/Behavioral: Negative for confusion and dysphoric mood.     Physical Exam Updated Vital Signs BP (!) 169/96   Pulse (!) 107   Temp 98.4 F (36.9 C) (Oral)   Resp 17   Ht 6\' 1"  (1.854 m)   Wt 96.6 kg (213 lb)   SpO2 100%   BMI 28.10 kg/m   Physical Exam  Constitutional: He is oriented to person, place, and time. He appears well-developed and well-nourished.  HENT:  Head: Normocephalic and atraumatic.  Eyes: EOM are normal. Pupils are equal, round, and reactive to light.  Neck: Normal range of motion. Neck supple. No JVD present.  Cardiovascular: Regular rhythm. Tachycardia present. Exam reveals no gallop and no friction rub.  No murmur heard. Pulmonary/Chest: Tachypnea noted. No respiratory distress. He has no wheezes.  Abdominal: He exhibits no distension. There is no rebound and no guarding.  Musculoskeletal: Normal range of motion.  Neurological: He is alert and oriented to person, place, and time.  Skin: No rash noted. No pallor.  Psychiatric: He has a normal mood and affect. His behavior is normal.  Nursing  note and vitals reviewed.    ED Treatments / Results  Labs (all labs ordered are listed, but only abnormal results are displayed) Labs Reviewed  COMPREHENSIVE METABOLIC PANEL - Abnormal; Notable for the following components:      Result Value   Sodium 130 (*)    Chloride 92 (*)    CO2 12 (*)    Glucose, Bld 508 (*)    BUN 34 (*)    Creatinine, Ser 2.20 (*)    Total Protein 8.4 (*)    ALT 16 (*)    Total Bilirubin 1.6 (*)    GFR calc non Af Amer 32 (*)    GFR calc Af Amer 37 (*)    Anion gap 26 (*)    All other components within normal limits  URINALYSIS, ROUTINE W REFLEX MICROSCOPIC - Abnormal; Notable for the following components:   Glucose, UA >=  500 (*)    Hgb urine dipstick SMALL (*)    Bilirubin Urine SMALL (*)    Ketones, ur >80 (*)    Protein, ur 30 (*)    All other components within normal limits  URINALYSIS, MICROSCOPIC (REFLEX) - Abnormal; Notable for the following components:   Bacteria, UA RARE (*)    Squamous Epithelial / LPF 0-5 (*)    All other components within normal limits  CBG MONITORING, ED - Abnormal; Notable for the following components:   Glucose-Capillary 403 (*)    All other components within normal limits  LIPASE, BLOOD  CBC  TROPONIN I    EKG  EKG Interpretation  Date/Time:  Tuesday September 28 2017 01:19:55 EDT Ventricular Rate:  114 PR Interval:  164 QRS Duration: 86 QT Interval:  340 QTC Calculation: 468 R Axis:   -12 Text Interpretation:  Sinus tachycardia Otherwise normal ECG No significant change since last tracing Confirmed by Melene Plan [phone]) on 09/28/2017 1:38:14 AM       Radiology No results found.  Procedures Procedures (including critical care time)  Medications Ordered in ED Medications  insulin regular (NOVOLIN R,HUMULIN R) 100 Units in sodium chloride 0.9 % 100 mL (1 Units/mL) infusion (3.4 Units/hr Intravenous Rate/Dose Change 09/28/17 0415)  dextrose 5 %-0.45 % sodium chloride infusion (not administered)    lactated ringers infusion (not administered)  ondansetron (ZOFRAN-ODT) disintegrating tablet 4 mg (4 mg Oral Given 09/28/17 0125)  sodium chloride 0.9 % bolus 2,000 mL (2,000 mLs Intravenous New Bag/Given 09/28/17 0215)  potassium chloride 10 mEq in 100 mL IVPB (10 mEq Intravenous New Bag/Given 09/28/17 0405)  LORazepam (ATIVAN) injection 1 mg (1 mg Intravenous Given 09/28/17 0424)     Initial Impression / Assessment and Plan / ED Course  I have reviewed the triage vital signs and the nursing notes.  Pertinent labs & imaging results that were available during my care of the patient were reviewed by me and considered in my medical decision making (see chart for details).     57 yo M with a chief complaint of emesis.  Going on for the past week or so.  Labs here are concerning for diabetic ketoacidosis with hyperglycemia and an elevated anion gap.  Patient is tachycardic into the 120s is mildly tachypneic.  We will give 2 L of IV fluids and started on insulin drip.  Will discuss with the hospitalist.  CRITICAL CARE Performed by: Rae Roam   Total critical care time: 35 minutes  Critical care time was exclusive of separately billable procedures and treating other patients.  Critical care was necessary to treat or prevent imminent or life-threatening deterioration.  Critical care was time spent personally by me on the following activities: development of treatment plan with patient and/or surrogate as well as nursing, discussions with consultants, evaluation of patient's response to treatment, examination of patient, obtaining history from patient or surrogate, ordering and performing treatments and interventions, ordering and review of laboratory studies, ordering and review of radiographic studies, pulse oximetry and re-evaluation of patient's condition.  The patients results and plan were reviewed and discussed.   Any x-rays performed were independently reviewed by myself.    Differential diagnosis were considered with the presenting HPI.  Medications  insulin regular (NOVOLIN R,HUMULIN R) 100 Units in sodium chloride 0.9 % 100 mL (1 Units/mL) infusion (3.4 Units/hr Intravenous Rate/Dose Change 09/28/17 0415)  dextrose 5 %-0.45 % sodium chloride infusion (not administered)  lactated ringers infusion (not administered)  ondansetron (ZOFRAN-ODT) disintegrating tablet 4 mg (4 mg Oral Given 09/28/17 0125)  sodium chloride 0.9 % bolus 2,000 mL (2,000 mLs Intravenous New Bag/Given 09/28/17 0215)  potassium chloride 10 mEq in 100 mL IVPB (10 mEq Intravenous New Bag/Given 09/28/17 0405)  LORazepam (ATIVAN) injection 1 mg (1 mg Intravenous Given 09/28/17 0424)    Vitals:   09/28/17 0400 09/28/17 0415 09/28/17 0430 09/28/17 0445  BP: (!) 176/105  (!) 169/96   Pulse:  (!) 107 (!) 107   Resp: (!) 25 (!) 23 20 17   Temp:      TempSrc:      SpO2:  100% 100%   Weight:      Height:        Final diagnoses:  Diabetic ketoacidosis without coma associated with type 2 diabetes mellitus (HCC)    Admission/ observation were discussed with the admitting physician, patient and/or family and they are comfortable with the plan.   Final Clinical Impressions(s) / ED Diagnoses   Final diagnoses:  Diabetic ketoacidosis without coma associated with type 2 diabetes mellitus [facility])    ED Discharge Orders    None       Melene Plan, DO 09/28/17 0507

## 2017-09-28 NOTE — H&P (Signed)
History and Physical    Joel Mcdonald ZOX:[id number] DOB: February 08, 1961 DOA: 09/28/2017  PCP: Joel Ladd, MD  Patient coming from: Home.  Chief Complaint: Nausea vomiting.  HPI: Joel Mcdonald is a 57 y.o. male with history of diabetes mellitus type 2, hypertension, chronic kidney disease presents to the ER admits in [facility] with complaints of nausea and not feeling well.  He has been having hiccups with chest discomfort during the hiccups.  Patient states he has not been taking his medications for last 6 months due to insurance issues and has not followed up with his primary care physicians.  Patient denies any fever chills diarrhea abdominal pain or shortness of breath.  ED Course: In the ER patient was found to have blood sugar of 508 with anion gap of 26 and bicarb of 12.  Patient was started on IV fluid bolus for DKA and started on insulin infusion.  On my exam patient abdomen appears benign.  Patient at the time of reaching our hospital has been already on insulin drip for at least 12 hours.  Anion gap has closed to 9.  Review of Systems: As per HPI, rest all negative.   Past Medical History:  Diagnosis Date  . Diabetes mellitus without complication (HCC)   . Hypertension     Past Surgical History:  Procedure Laterality Date  . TESTICLE TORSION REDUCTION       reports that  has never smoked. he has never used smokeless tobacco. He reports that he does not drink alcohol or use drugs.  No Known Allergies  Family History  Problem Relation Age of Onset  . Stroke Mother   . Hypertension Father   . Diabetes Mellitus II Sister   . Heart attack Neg Hx     Prior to Admission medications   Medication Sig Start Date End Date Taking? Authorizing Provider  amoxicillin-clavulanate (AUGMENTIN) 875-125 MG tablet Take 1 tablet by mouth 2 (two) times daily. Patient not taking: Reported on 09/28/2017 03/31/17   Felecia Shelling, DPM  atorvastatin (LIPITOR) 40 MG tablet Take 40 mg by  mouth every morning.    [provider]  BYDUREON 2 MG PEN  03/04/17   [provider]  carvedilol (COREG) 12.5 MG tablet  12/30/16   [provider]  dicyclomine (BENTYL) 20 MG tablet Take 1 tablet (20 mg total) by mouth 2 (two) times daily. Patient not taking: Reported on 09/28/2017 03/15/17   Arthor Captain, PA-C  Exenatide ER (BYDUREON) 2 MG SRER Inject 2 mg into the skin every Sunday.  06/28/14   [provider]  glimepiride (AMARYL) 4 MG tablet Take 4 mg by mouth 2 (two) times daily.    [provider]  Insulin Glargine (TOUJEO SOLOSTAR) 300 UNIT/ML SOPN INJECT 20 UNITS SUBCUTANEOUSLY ONCE DAILY 12/31/16   [provider]  insulin lispro (HUMALOG) 100 UNIT/ML injection Inject into the skin as needed. <150 - no insulin 151-200 - Take 6 units 201-250 - Take 8 untis 251-300 - Take 10 units 301-350 - Take 12 units 351-400 - Take 14 units >400 - Take 16 units 09/05/15   [provider]  losartan-hydrochlorothiazide (HYZAAR) 100-25 MG tablet  03/05/17   [provider]  metFORMIN (GLUCOPHAGE) 1000 MG tablet Take 500 mg by mouth daily with breakfast.     [provider]  ondansetron (ZOFRAN) 4 MG tablet Take 1 tablet (4 mg total) by mouth every 8 (eight) hours as needed for nausea or  vomiting. Patient not taking: Reported on 09/28/2017 03/15/17   Arthor Captain, PA-C  ONE [facility] ULTRA TEST test strip  12/30/16   [provider]  pioglitazone (ACTOS) 30 MG tablet Take one tab po daily 08/25/16   [provider]  Semaglutide (OZEMPIC [name]) Inject 0.25 mg into the skin once a week.     [provider]  sulfamethoxazole-trimethoprim (BACTRIM DS,SEPTRA DS) 800-160 MG tablet  03/08/17   [provider]  testosterone (ANDROGEL) 50 MG/5GM (1%) GEL Place 5 g onto the skin daily.    [provider]  valsartan-hydrochlorothiazide (DIOVAN-HCT) 320-25 MG tablet Take 1 tablet by mouth daily.  08/17/08   [provider]    Physical Exam: Vitals:   09/28/17 1616 09/28/17 1632 09/28/17 1830 09/28/17 1900  BP: (!) 167/102 (!) 185/113 (!) 174/102 (!) 169/103  Pulse: 99 (!) 104 99 97  Resp: 18 15 11 13   Temp:      TempSrc:      SpO2: 100% 100% 100% 100%  Weight:      Height:          Constitutional: Moderately built and nourished. Vitals:   09/28/17 1616 09/28/17 1632 09/28/17 1830 09/28/17 1900  BP: (!) 167/102 (!) 185/113 (!) 174/102 (!) 169/103  Pulse: 99 (!) 104 99 97  Resp: 18 15 11 13   Temp:      TempSrc:      SpO2: 100% 100% 100% 100%  Weight:      Height:       Eyes: Anicteric no pallor. ENMT: No discharge from the ears eyes nose or mouth. Neck: No neck rigidity no mass felt.  No JVD appreciated. Respiratory: No rhonchi or crepitations. Cardiovascular: S1-S2 heard no murmurs appreciated. Abdomen: Soft nontender bowel sounds present. Musculoskeletal: No edema.  No joint effusion. Skin: No rash.  Skin appears warm. Neurologic: Alert awake oriented to time place and person.  Moves all extremities. Psychiatric: Appears normal.  Normal affect.   Labs on Admission: I have personally reviewed following labs and imaging studies  CBC: Recent Labs  Lab 09/28/17 0125  WBC 8.3  HGB 14.8  HCT 43.1  MCV 80.1  PLT 260   Basic Metabolic Panel: Recent Labs  Lab 09/28/17 0125 09/28/17 0613 09/28/17 1706  NA 130* 136 138  K 4.7 4.7 3.5  CL 92* 101 106  CO2 12* 14* 23  GLUCOSE 508* 335* 151*  BUN 34* 30* 24*  CREATININE 2.20* 1.93* 1.42*  CALCIUM 9.8 9.4 9.0   GFR: Estimated Creatinine Clearance: 71.2 mL/min (A) (by C-G formula based on SCr of 1.42 mg/dL (H)). Liver Function Tests: Recent Labs  Lab 09/28/17 0125 09/28/17 1706  AST 17 15  ALT 16* 13*  ALKPHOS 75 62  BILITOT 1.6* 0.8  PROT 8.4* 7.4  ALBUMIN 3.9 3.3*   Recent Labs  Lab 09/28/17 0125  LIPASE 44   No results for input(s): AMMONIA in the last 168 hours. Coagulation  Profile: No results for input(s): INR, PROTIME in the last 168 hours. Cardiac Enzymes: Recent Labs  Lab 09/28/17 0125  TROPONINI <0.03   BNP (last 3 results) No results for input(s): PROBNP in the last 8760 hours. HbA1C: No results for input(s): HGBA1C in the last 72 hours. CBG: Recent Labs  Lab 09/28/17 1730 09/28/17 1840 09/28/17 1925 09/28/17 2026 09/28/17 2137  GLUCAP 142* 136* 168* 193* 213*   Lipid Profile: No results for input(s): CHOL, HDL, LDLCALC, TRIG, CHOLHDL, LDLDIRECT in the last 72 hours.  Thyroid Function Tests: No results for input(s): TSH, T4TOTAL, FREET4, T3FREE, THYROIDAB in the last 72 hours. Anemia Panel: No results for input(s): VITAMINB12, FOLATE, FERRITIN, TIBC, IRON, RETICCTPCT in the last 72 hours. Urine analysis:    Component Value Date/Time   COLORURINE YELLOW 09/28/2017 0417   APPEARANCEUR CLEAR 09/28/2017 0417   LABSPEC 1.015 09/28/2017 0417   PHURINE 5.5 09/28/2017 0417   GLUCOSEU >=500 (A) 09/28/2017 0417   HGBUR SMALL (A) 09/28/2017 0417   BILIRUBINUR SMALL (A) 09/28/2017 0417   KETONESUR >80 (A) 09/28/2017 0417   PROTEINUR 30 (A) 09/28/2017 0417   UROBILINOGEN 1.0 10/10/2014 0219   NITRITE NEGATIVE 09/28/2017 0417   LEUKOCYTESUR NEGATIVE 09/28/2017 0417   Sepsis Labs: @LABRCNTIP (procalcitonin:4,lacticidven:4) )No results found for this or any previous visit (from the past 240 hour(s)).   Radiological Exams on Admission: No results found.   Assessment/Plan Principal Problem:   DKA (diabetic ketoacidoses) (HCC) Active Problems:   Nausea & vomiting   Hypertension   CKD (chronic kidney disease) stage 3, GFR 30-59 ml/min (HCC)    1. Diabetic ketoacidosis and type 2 diabetes -likely precipitated by patient on being compliant with medications.  Patient was started on insulin infusion and IV fluids and has been on the infusion for at least 12 hours and at this time of my exam anion gap is corrected and I have placed patient on 15  units of Lantus following which we will be discontinuing the insulin infusion in 2 hours.  We will start diet and check metabolic panel and hemoglobin [id number].  Patient advised to be compliant with medications. 2. Hypertensive urgency -patient has been placed on PRN IV hydralazine for now and closely follow blood pressure trends.  May start amlodipine if blood pressure tends to remain high. 3. Acute on chronic kidney disease stage III -creatinine likely worsened from dehydration from DKA.  Follow metabolic panel as patient is receiving fluids. 4. Nausea vomiting with hiccups -could be due to metabolic reasons.  Abdomen appears benign.  LFTs are normal.  Closely observe.  EKG and troponin are pending.   DVT prophylaxis: Lovenox. Code Status: Full code. Family Communication: Family at the bedside. Disposition Plan: Home. Consults called: None. Admission status: Inpatient.   Eduard ClosArshad N Jeffrie Stander MD [facility] Pager [phone].  If 7PM-7AM, please contact night-coverage [email or web address] Password [email or web address]  09/28/2017, 10:20 PM

## 2017-09-29 ENCOUNTER — Other Ambulatory Visit: Payer: Self-pay

## 2017-09-29 DIAGNOSIS — N183 Chronic kidney disease, stage 3 (moderate): Secondary | ICD-10-CM

## 2017-09-29 DIAGNOSIS — N179 Acute kidney failure, unspecified: Secondary | ICD-10-CM

## 2017-09-29 LAB — GLUCOSE, CAPILLARY
GLUCOSE-CAPILLARY: 346 mg/dL — AB (ref 65–99)
GLUCOSE-CAPILLARY: 284 mg/dL — AB (ref 65–99)
GLUCOSE-CAPILLARY: 243 mg/dL — AB (ref 65–99)
Glucose-Capillary: 410 mg/dL — ABNORMAL HIGH (ref 65–99)
Glucose-Capillary: 160 mg/dL — ABNORMAL HIGH (ref 65–99)
Glucose-Capillary: 226 mg/dL — ABNORMAL HIGH (ref 65–99)

## 2017-09-29 LAB — BASIC METABOLIC PANEL
ANION GAP: 10 (ref 5–15)
BUN: 17 mg/dL (ref 6–20)
CHLORIDE: 101 mmol/L (ref 101–111)
CO2: 23 mmol/L (ref 22–32)
Calcium: 9 mg/dL (ref 8.9–10.3)
Creatinine, Ser: 1.28 mg/dL — ABNORMAL HIGH (ref 0.61–1.24)
Glucose, Bld: 392 mg/dL — ABNORMAL HIGH (ref 65–99)
POTASSIUM: 3.9 mmol/L (ref 3.5–5.1)
Sodium: 134 mmol/L — ABNORMAL LOW (ref 135–145)

## 2017-09-29 LAB — CBC
HEMATOCRIT: 36.8 % — AB (ref 39.0–52.0)
Hemoglobin: 12.5 g/dL — ABNORMAL LOW (ref 13.0–17.0)
MCH: 27.6 pg (ref 26.0–34.0)
MCHC: 34 g/dL (ref 30.0–36.0)
MCV: 81.2 fL (ref 78.0–100.0)
Platelets: 196 10*3/uL (ref 150–400)
RBC: 4.53 MIL/uL (ref 4.22–5.81)
RDW: 13.1 % (ref 11.5–15.5)
WBC: 7.4 10*3/uL (ref 4.0–10.5)

## 2017-09-29 LAB — TROPONIN I

## 2017-09-29 LAB — MRSA PCR SCREENING: MRSA BY PCR: NEGATIVE

## 2017-09-29 LAB — HIV ANTIBODY (ROUTINE TESTING W REFLEX): HIV Screen 4th Generation wRfx: NONREACTIVE

## 2017-09-29 LAB — TSH: TSH: 0.609 u[IU]/mL (ref 0.350–4.500)

## 2017-09-29 MED ORDER — ATORVASTATIN CALCIUM 40 MG PO TABS
40.0000 mg | ORAL_TABLET | Freq: Every day | ORAL | Status: DC
  Administered 2017-09-29 – 2017-09-30 (×2): 40 mg via ORAL
  Filled 2017-09-29 (×2): qty 1

## 2017-09-29 MED ORDER — LOSARTAN POTASSIUM 50 MG PO TABS
100.0000 mg | ORAL_TABLET | Freq: Every day | ORAL | Status: DC
  Administered 2017-09-29 – 2017-09-30 (×2): 100 mg via ORAL
  Filled 2017-09-29 (×2): qty 2

## 2017-09-29 MED ORDER — SODIUM CHLORIDE 0.9 % IV BOLUS (SEPSIS)
500.0000 mL | Freq: Once | INTRAVENOUS | Status: AC
  Administered 2017-09-29: 500 mL via INTRAVENOUS

## 2017-09-29 MED ORDER — INSULIN ASPART 100 UNIT/ML ~~LOC~~ SOLN
0.0000 [IU] | Freq: Every day | SUBCUTANEOUS | Status: DC
  Administered 2017-09-29: 2 [IU] via SUBCUTANEOUS

## 2017-09-29 MED ORDER — VALSARTAN-HYDROCHLOROTHIAZIDE 320-25 MG PO TABS: 1.0000 | ORAL_TABLET | Freq: Every day | ORAL | Status: DC

## 2017-09-29 MED ORDER — CARVEDILOL 12.5 MG PO TABS
12.5000 mg | ORAL_TABLET | Freq: Two times a day (BID) | ORAL | Status: DC
  Administered 2017-09-29 – 2017-09-30 (×3): 12.5 mg via ORAL
  Filled 2017-09-29 (×3): qty 1

## 2017-09-29 MED ORDER — HYDROCHLOROTHIAZIDE 25 MG PO TABS
25.0000 mg | ORAL_TABLET | Freq: Every day | ORAL | Status: DC
  Administered 2017-09-29 – 2017-09-30 (×2): 25 mg via ORAL
  Filled 2017-09-29 (×2): qty 1

## 2017-09-29 MED ORDER — INSULIN ASPART 100 UNIT/ML ~~LOC~~ SOLN
3.0000 [IU] | Freq: Three times a day (TID) | SUBCUTANEOUS | Status: DC
  Administered 2017-09-29 – 2017-09-30 (×3): 3 [IU] via SUBCUTANEOUS

## 2017-09-29 MED ORDER — INSULIN ASPART 100 UNIT/ML ~~LOC~~ SOLN
5.0000 [IU] | Freq: Once | SUBCUTANEOUS | Status: AC
  Administered 2017-09-29: 5 [IU] via SUBCUTANEOUS

## 2017-09-29 MED ORDER — INSULIN ASPART 100 UNIT/ML ~~LOC~~ SOLN
0.0000 [IU] | Freq: Three times a day (TID) | SUBCUTANEOUS | Status: DC
  Administered 2017-09-29: 3 [IU] via SUBCUTANEOUS
  Administered 2017-09-29: 2 [IU] via SUBCUTANEOUS
  Administered 2017-09-29: 5 [IU] via SUBCUTANEOUS
  Administered 2017-09-30: 2 [IU] via SUBCUTANEOUS
  Administered 2017-09-30: 3 [IU] via SUBCUTANEOUS

## 2017-09-29 MED ORDER — LOSARTAN POTASSIUM-HCTZ 100-25 MG PO TABS: 1.0000 | ORAL_TABLET | Freq: Every day | ORAL | Status: DC

## 2017-09-29 MED ORDER — INSULIN GLARGINE 100 UNIT/ML ~~LOC~~ SOLN
10.0000 [IU] | Freq: Two times a day (BID) | SUBCUTANEOUS | Status: DC
  Administered 2017-09-29 – 2017-09-30 (×3): 10 [IU] via SUBCUTANEOUS
  Filled 2017-09-29 (×4): qty 0.1

## 2017-09-29 NOTE — Progress Notes (Signed)
Patient got up with help to go to restroom. Patient had a syncopal episode. Patient describes episode as "I feel like I fell asleep". VS checked and WNL except for BP which is 77/55 on dinamap. BP checked manually and is 78/54. BP's throughout the day and overnight were very high. Patient states that when his BP is low he has episodes were he "passes out". Provider on call notified. Will continue to monitor.

## 2017-09-29 NOTE — Progress Notes (Signed)
[facility] PROGRESS NOTE    Progress Note  Joel JanRicky D Traister  OZD:[id number] DOB: 02/17/1961 DOA: 09/28/2017 PCP: Ileana LaddWong, Francis P, MD     Brief Narrative:   Joel Mcdonald is an 57 y.o. male past medical history of diabetes mellitus type 2, chronic renal disease stage III with creatinine of 1.3-1.6 who presents to [facility] in DKA due to noncompliance  Assessment/Plan:   DKA (diabetic ketoacidoses) [facility](HCC): Patient started on insulin drip, anion gap is closed bicarbonate is greater than 20. Start him on long-acting insulin divided into twice daily, change sliding scale insulin. A1c is pending. Consult case manager for assistance with medications His etiology of DKA is likely due to noncompliance as he cannot afford his insulin.  Essential Hypertension Resume home regimen. KVO IV fluids.  New Acute kidney injury on CKD (chronic kidney disease) stage 3, GFR 30-59 ml/min (HCC) With a baseline creatinine of 1.2-1.5, on admission it was 2.3, improved with IV fluid. Now at baseline.   DVT prophylaxis: lovenox Family Communication:wife Disposition Plan/Barrier to D/C: home in am Code Status:     Code Status Orders  (From admission, onward)        Start     Ordered   09/28/17 2218  Full code  Continuous     09/28/17 2219    Code Status History    Date Active Date Inactive Code Status Order ID Comments User Context   10/09/2014 22:56 10/10/2014 18:32 Full Code [id number]  Eduard ClosKakrakandy, Arshad N, MD Inpatient        IV Access:    Peripheral IV   Procedures and diagnostic studies:   No results found.   Medical Consultants:    None.  Anti-Infectives:   None  Subjective:    Joel Janicky D Hubers he relates that he feels close to baseline no new complaints.  Objective:    Vitals:   09/28/17 2352 09/29/17 0000 09/29/17 0354 09/29/17 0400  BP:  (!) 135/98  136/71  Pulse:  (!) 101  97  Resp:  19  (!) 21  Temp: 98.2 F (36.8 C)  98 F (36.7 C)     TempSrc: Oral  Oral   SpO2:  97%  96%  Weight:      Height:        Intake/Output Summary (Last 24 hours) at 09/29/2017 0733 Last data filed at 09/29/2017 0600 Gross per 24 hour  Intake 800 ml  Output 1800 ml  Net -1000 ml   Filed Weights   09/28/17 0107  Weight: 96.6 kg (213 lb)    Exam: General exam: In no acute distress. Respiratory system: Good air movement clear to auscultation Cardiovascular system: S1 & S2 heard, RRR. No JVD. Gastrointestinal system: Abdomen is nondistended, soft and nontender.  Central nervous system: Alert and oriented x3 nonfocal Extremities: Trace lower extremity edema.  He has missing his second toe in the right foot Skin: Dry skin no rashes. Psychiatry: Judgement and insight appear normal. Mood & affect appropriate.    Data Reviewed:    Labs: Basic Metabolic Panel: Recent Labs  Lab 09/28/17 0613 09/28/17 1706 09/28/17 2037 09/28/17 2306 09/29/17 0322  NA 136 138 QUESTIONABLE RESULTS, RECOMMEND RECOLLECT TO VERIFY 137 134*  K 4.7 3.5 QUESTIONABLE RESULTS, RECOMMEND RECOLLECT TO VERIFY 3.6 3.9  CL 101 106 QUESTIONABLE RESULTS, RECOMMEND RECOLLECT TO VERIFY 105 101  CO2 14* 23 QUESTIONABLE RESULTS, RECOMMEND RECOLLECT TO VERIFY 21* 23  GLUCOSE 335* 151* QUESTIONABLE RESULTS, RECOMMEND RECOLLECT TO VERIFY 316*  392*  BUN 30* 24* QUESTIONABLE RESULTS, RECOMMEND RECOLLECT TO VERIFY 19 17  CREATININE 1.93* 1.42* QUESTIONABLE RESULTS, RECOMMEND RECOLLECT TO VERIFY 1.31* 1.28*  CALCIUM 9.4 9.0 QUESTIONABLE RESULTS, RECOMMEND RECOLLECT TO VERIFY 9.1 9.0   GFR Estimated Creatinine Clearance: 78.9 mL/min (A) (by C-G formula based on SCr of 1.28 mg/dL (H)). Liver Function Tests: Recent Labs  Lab 09/28/17 0125 09/28/17 1706  AST 17 15  ALT 16* 13*  ALKPHOS 75 62  BILITOT 1.6* 0.8  PROT 8.4* 7.4  ALBUMIN 3.9 3.3*   Recent Labs  Lab 09/28/17 0125  LIPASE 44   No results for input(s): AMMONIA in the last 168 hours. Coagulation  profile No results for input(s): INR, PROTIME in the last 168 hours.  CBC: Recent Labs  Lab 09/28/17 0125 09/28/17 2306 09/29/17 0322  WBC 8.3 8.5 7.4  HGB 14.8 13.0 12.5*  HCT 43.1 37.8* 36.8*  MCV 80.1 81.3 81.2  PLT 260 208 196   Cardiac Enzymes: Recent Labs  Lab 09/28/17 0125 09/28/17 2306  TROPONINI <0.03 <0.03   BNP (last 3 results) No results for input(s): PROBNP in the last 8760 hours. CBG: Recent Labs  Lab 09/28/17 2026 09/28/17 2137 09/28/17 2304 09/29/17 0345 09/29/17 0553  GLUCAP 193* 213* 305* 410* 346*   D-Dimer: No results for input(s): DDIMER in the last 72 hours. Hgb A1c: No results for input(s): HGBA1C in the last 72 hours. Lipid Profile: No results for input(s): CHOL, HDL, LDLCALC, TRIG, CHOLHDL, LDLDIRECT in the last 72 hours. Thyroid function studies: Recent Labs    09/28/17 2306  TSH 0.609   Anemia work up: No results for input(s): VITAMINB12, FOLATE, FERRITIN, TIBC, IRON, RETICCTPCT in the last 72 hours. Sepsis Labs: Recent Labs  Lab 09/28/17 0125 09/28/17 2306 09/29/17 0322  WBC 8.3 8.5 7.4   Microbiology Recent Results (from the past 240 hour(s))  MRSA PCR Screening     Status: None   Collection Time: 09/28/17 10:00 PM  Result Value Ref Range Status   MRSA by PCR NEGATIVE NEGATIVE Final    Comment:        The GeneXpert MRSA Assay (FDA approved for NASAL specimens only), is one component of a comprehensive MRSA colonization surveillance program. It is not intended to diagnose MRSA infection nor to guide or monitor treatment for MRSA infections. Performed at [facility], [address] [place], [place] [address]      Medications:   . enoxaparin (LOVENOX) injection  40 mg Subcutaneous QHS  . insulin aspart  0-9 Units Subcutaneous TID WC  . insulin glargine  15 Units Subcutaneous QHS   Continuous Infusions: . lactated ringers 125 mL/hr at 09/29/17 0044     LOS: 1 day   Marinda Elk  [facility] Pager [phone]  *Please refer to [email or web address], password [email or web address] to get updated schedule on who will round on this patient, as hospitalists switch teams weekly. If 7PM-7AM, please contact night-coverage at [email or web address], password [email or web address] for any overnight needs.  09/29/2017, 7:33 AM

## 2017-09-30 LAB — GLUCOSE, CAPILLARY
Glucose-Capillary: 189 mg/dL — ABNORMAL HIGH (ref 65–99)
Glucose-Capillary: 221 mg/dL — ABNORMAL HIGH (ref 65–99)

## 2017-09-30 LAB — HEMOGLOBIN A1C
Hgb A1c MFr Bld: >15.5 % — ABNORMAL HIGH (ref 4.8–5.6)
Mean Plasma Glucose: >398 mg/dL

## 2017-09-30 MED ORDER — INSULIN GLARGINE 100 UNIT/ML ~~LOC~~ SOLN: 20.0000 [IU] | Freq: Every day | SUBCUTANEOUS | 12 refills | Status: AC

## 2017-09-30 MED ORDER — METFORMIN HCL 1000 MG PO TABS: 500.0000 mg | ORAL_TABLET | Freq: Every day | ORAL | 3 refills | Status: AC

## 2017-09-30 MED ORDER — GLIMEPIRIDE 4 MG PO TABS: 4.0000 mg | ORAL_TABLET | Freq: Two times a day (BID) | ORAL | 0 refills | Status: AC

## 2017-09-30 MED FILL — GLIMEPIRIDE 4 MG TABLET: 4 | 15 days supply | Qty: 30 | Fill #0

## 2017-09-30 MED FILL — metFORMIN HCL 1000 MG TABS: 1000 | 30 days supply | Qty: 15 | Fill #0

## 2017-09-30 MED FILL — LANTUS 100 UNITS/ML VIAL: 100 | 28 days supply | Qty: 10 | Fill #0 | Status: TO

## 2017-09-30 NOTE — Progress Notes (Signed)
Pt provided with MATCH letter for discharge medications. Sandford Crazeora Zakia Sainato RN,BSN,NCM [phone]

## 2017-09-30 NOTE — Discharge Summary (Signed)
Physician Discharge Summary  Joel Mcdonald [id number] DOB: 12/02/1960 DOA: 09/28/2017  PCP: Ileana LaddWong, Francis P, MD  Admit date: 09/28/2017 Discharge date: 09/30/2017  Admitted From: home Disposition:  Home  Recommendations for Outpatient Follow-up:  1. Follow up with PCP in 1-2 weeks, titrate long-acting insulin as needed. 2. Please obtain BMP/CBC in one week   Home Health:No Equipment/Devices:none  Discharge Condition:stable CODE STATUS:full Diet recommendation: Heart Healthy   Brief/Interim Summary: 57 y.o. male past medical history of diabetes mellitus type 2, chronic renal disease stage III with creatinine of 1.3-1.6 who presents to [facility] in DKA due to noncompliance  Discharge Diagnoses:  Principal Problem:   DKA (diabetic ketoacidoses) (HCC) Active Problems:   Nausea & vomiting   Hypertension   CKD (chronic kidney disease) stage 3, GFR 30-59 ml/min (HCC)   AKI (acute kidney injury) (HCC)  DKA in the setting of noncompliance of medication due to lack of insurance: He was admitted to the stepdown started on IV insulin 1 his bicarbonate was greater than 20 and anion gap closed he was changed to long-acting insulin. He will go home on Lantus 20 mg, glimepiride and metformin. He would check CBGs T and at bedtime and follow-up with PCP as an outpatient to titrate medications as needed.  Essential hypertension: No changes were made.  New acute kidney injury on chronic kidney disease stage III: With a baseline creatinine of 1.2-1.5 on admission 2.3 likely prerenal in etiology the setting of DKA resolved with IV fluid hydration. Discharge Instructions  Discharge Instructions    Diet - low sodium heart healthy   Complete by:  As directed    Increase activity slowly   Complete by:  As directed      Allergies as of 09/30/2017   No Known Allergies     Medication List    STOP taking these medications   amoxicillin-clavulanate 875-125 MG tablet Commonly  known as:  AUGMENTIN   OZEMPIC    pioglitazone 30 MG tablet Commonly known as:  ACTOS   sulfamethoxazole-trimethoprim 800-160 MG tablet Commonly known as:  BACTRIM DS,SEPTRA DS   testosterone 50 MG/5GM (1%) Gel Commonly known as:  ANDROGEL   TOUJEO SOLOSTAR 300 UNIT/ML Sopn Generic drug:  Insulin Glargine Replaced by:  insulin glargine 100 UNIT/ML injection   valsartan-hydrochlorothiazide 320-25 MG tablet Commonly known as:  DIOVAN-HCT     TAKE these medications   atorvastatin 40 MG tablet Commonly known as:  LIPITOR Take 40 mg by mouth every morning.   BYDUREON 2 MG Srer Generic drug:  Exenatide ER Inject 2 mg into the skin every Sunday.   BYDUREON 2 MG Pen Generic drug:  Exenatide ER   carvedilol 12.5 MG tablet Commonly known as:  COREG   dicyclomine 20 MG tablet Commonly known as:  BENTYL Take 1 tablet (20 mg total) by mouth 2 (two) times daily.   glimepiride 4 MG tablet Commonly known as:  AMARYL Take 1 tablet (4 mg total) by mouth 2 (two) times daily.   insulin glargine 100 UNIT/ML injection Commonly known as:  LANTUS Inject 0.2 mLs (20 Units total) into the skin at bedtime. Replaces:  TOUJEO SOLOSTAR 300 UNIT/ML Sopn   insulin lispro 100 UNIT/ML injection Commonly known as:  HUMALOG Inject into the skin as needed. <150 - no insulin 151-200 - Take 6 units 201-250 - Take 8 untis 251-300 - Take 10 units 301-350 - Take 12 units 351-400 - Take 14 units >400 - Take 16 units  losartan-hydrochlorothiazide 100-25 MG tablet Commonly known as:  HYZAAR   metFORMIN 1000 MG tablet Commonly known as:  GLUCOPHAGE Take 500 mg by mouth daily with breakfast.   ondansetron 4 MG tablet Commonly known as:  ZOFRAN Take 1 tablet (4 mg total) by mouth every 8 (eight) hours as needed for nausea or vomiting.   ONE TOUCH ULTRA TEST test strip Generic drug:  glucose blood       No Known Allergies  Consultations:  none   Procedures/Studies: No results  found.   Subjective: Feels great wants to go home.  Discharge Exam: Vitals:   09/29/17 2246 09/30/17 0500  BP: 121/74 124/76  Pulse:  85  Resp:  15  Temp:  98.7 F (37.1 C)  SpO2:  100%   Vitals:   09/29/17 1254 09/29/17 2137 09/29/17 2246 09/30/17 0500  BP: (!) 149/92 (!) 78/54 121/74 124/76  Pulse: 84   85  Resp: 13   15  Temp: 98.2 F (36.8 C)   98.7 F (37.1 C)  TempSrc: Oral   Oral  SpO2: 100%   100%  Weight:      Height:        General: Pt is alert, awake, not in acute distress Cardiovascular: RRR, S1/S2 +, no rubs, no gallops Respiratory: CTA bilaterally, no wheezing, no rhonchi Abdominal: Soft, NT, ND, bowel sounds + Extremities: no edema, no cyanosis    The results of significant diagnostics from this hospitalization (including imaging, microbiology, ancillary and laboratory) are listed below for reference.     Microbiology: Recent Results (from the past 240 hour(s))  MRSA PCR Screening     Status: None   Collection Time: 09/28/17 10:00 PM  Result Value Ref Range Status   MRSA by PCR NEGATIVE NEGATIVE Final    Comment:        The GeneXpert MRSA Assay (FDA approved for NASAL specimens only), is one component of a comprehensive MRSA colonization surveillance program. It is not intended to diagnose MRSA infection nor to guide or monitor treatment for MRSA infections. Performed at [facility], [address], [place], [place] [address]      Labs: BNP (last 3 results) No results for input(s): BNP in the last 8760 hours. Basic Metabolic Panel: Recent Labs  Lab 09/28/17 0613 09/28/17 1706 09/28/17 2037 09/28/17 2306 09/29/17 0322  NA 136 138 QUESTIONABLE RESULTS, RECOMMEND RECOLLECT TO VERIFY 137 134*  K 4.7 3.5 QUESTIONABLE RESULTS, RECOMMEND RECOLLECT TO VERIFY 3.6 3.9  CL 101 106 QUESTIONABLE RESULTS, RECOMMEND RECOLLECT TO VERIFY 105 101  CO2 14* 23 QUESTIONABLE RESULTS, RECOMMEND RECOLLECT TO VERIFY 21* 23  GLUCOSE  335* 151* QUESTIONABLE RESULTS, RECOMMEND RECOLLECT TO VERIFY 316* 392*  BUN 30* 24* QUESTIONABLE RESULTS, RECOMMEND RECOLLECT TO VERIFY 19 17  CREATININE 1.93* 1.42* QUESTIONABLE RESULTS, RECOMMEND RECOLLECT TO VERIFY 1.31* 1.28*  CALCIUM 9.4 9.0 QUESTIONABLE RESULTS, RECOMMEND RECOLLECT TO VERIFY 9.1 9.0   Liver Function Tests: Recent Labs  Lab 09/28/17 0125 09/28/17 1706  AST 17 15  ALT 16* 13*  ALKPHOS 75 62  BILITOT 1.6* 0.8  PROT 8.4* 7.4  ALBUMIN 3.9 3.3*   Recent Labs  Lab 09/28/17 0125  LIPASE 44   No results for input(s): AMMONIA in the last 168 hours. CBC: Recent Labs  Lab 09/28/17 0125 09/28/17 2306 09/29/17 0322  WBC 8.3 8.5 7.4  HGB 14.8 13.0 12.5*  HCT 43.1 37.8* 36.8*  MCV 80.1 81.3 81.2  PLT 260 208 196   Cardiac Enzymes:  Recent Labs  Lab 09/28/17 0125 09/28/17 2306  TROPONINI <0.03 <0.03   BNP: Invalid input(s): POCBNP CBG: Recent Labs  Lab 09/29/17 0752 09/29/17 1122 09/29/17 1738 09/29/17 2116 09/30/17 0725  GLUCAP 284* 160* 243* 226* 189*   D-Dimer No results for input(s): DDIMER in the last 72 hours. Hgb A1c Recent Labs    09/28/17 2306  HGBA1C >18.5*   Lipid Profile No results for input(s): CHOL, HDL, LDLCALC, TRIG, CHOLHDL, LDLDIRECT in the last 72 hours. Thyroid function studies Recent Labs    09/28/17 2306  TSH 0.609   Anemia work up No results for input(s): VITAMINB12, FOLATE, FERRITIN, TIBC, IRON, RETICCTPCT in the last 72 hours. Urinalysis    Component Value Date/Time   COLORURINE YELLOW 09/28/2017 0417   APPEARANCEUR CLEAR 09/28/2017 0417   LABSPEC 1.015 09/28/2017 0417   PHURINE 5.5 09/28/2017 0417   GLUCOSEU >=500 (A) 09/28/2017 0417   HGBUR SMALL (A) 09/28/2017 0417   BILIRUBINUR SMALL (A) 09/28/2017 0417   KETONESUR >80 (A) 09/28/2017 0417   PROTEINUR 30 (A) 09/28/2017 0417   UROBILINOGEN 1.0 10/10/2014 0219   NITRITE NEGATIVE 09/28/2017 0417   LEUKOCYTESUR NEGATIVE 09/28/2017 0417   Sepsis  Labs Invalid input(s): PROCALCITONIN,  WBC,  LACTICIDVEN Microbiology Recent Results (from the past 240 hour(s))  MRSA PCR Screening     Status: None   Collection Time: 09/28/17 10:00 PM  Result Value Ref Range Status   MRSA by PCR NEGATIVE NEGATIVE Final    Comment:        The GeneXpert MRSA Assay (FDA approved for NASAL specimens only), is one component of a comprehensive MRSA colonization surveillance program. It is not intended to diagnose MRSA infection nor to guide or monitor treatment for MRSA infections. Performed at [facility], [address], [place], [place] [address]      Time coordinating discharge: Over 30 minutes  SIGNED:   Marinda Elk, MD  [facility] 09/30/2017, 10:52 AM Pager   If 7PM-7AM, please contact night-coverage [email or web address] Password [email or web address]

## 2017-10-14 IMAGING — DX DG KNEE COMPLETE 4+V*R*
4 series · 4 of 4 positions shown · non-contrast
Comparison: None.

CLINICAL DATA: Acute right knee pain after fall today. Initial
encounter.

EXAM:
RIGHT KNEE - COMPLETE 4+ VIEW

[knee lat]
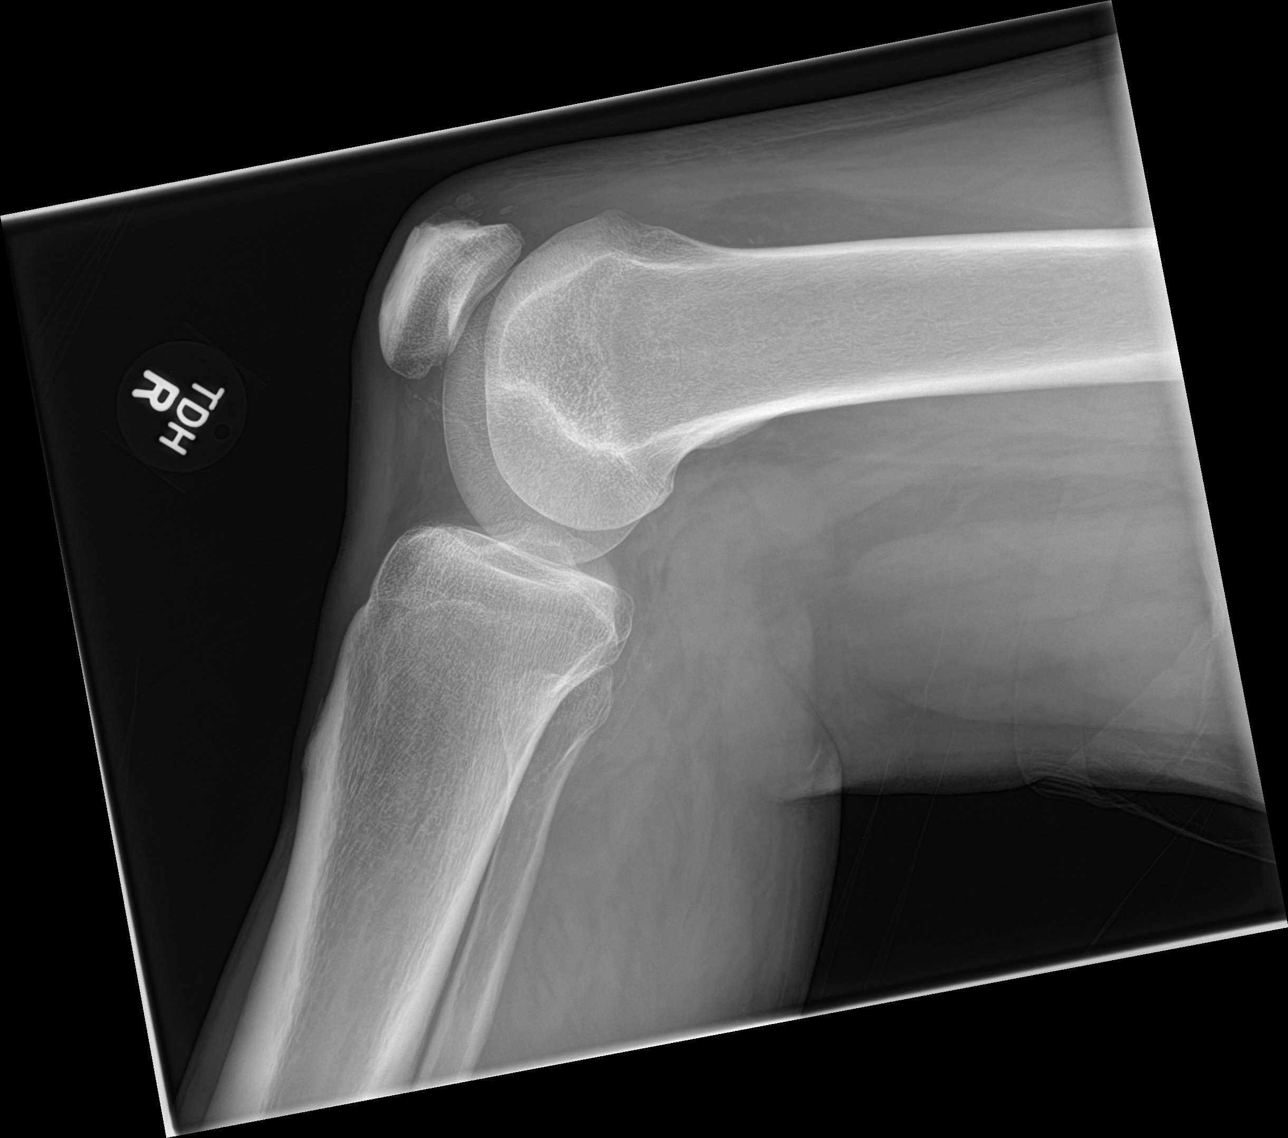

[knee ap]
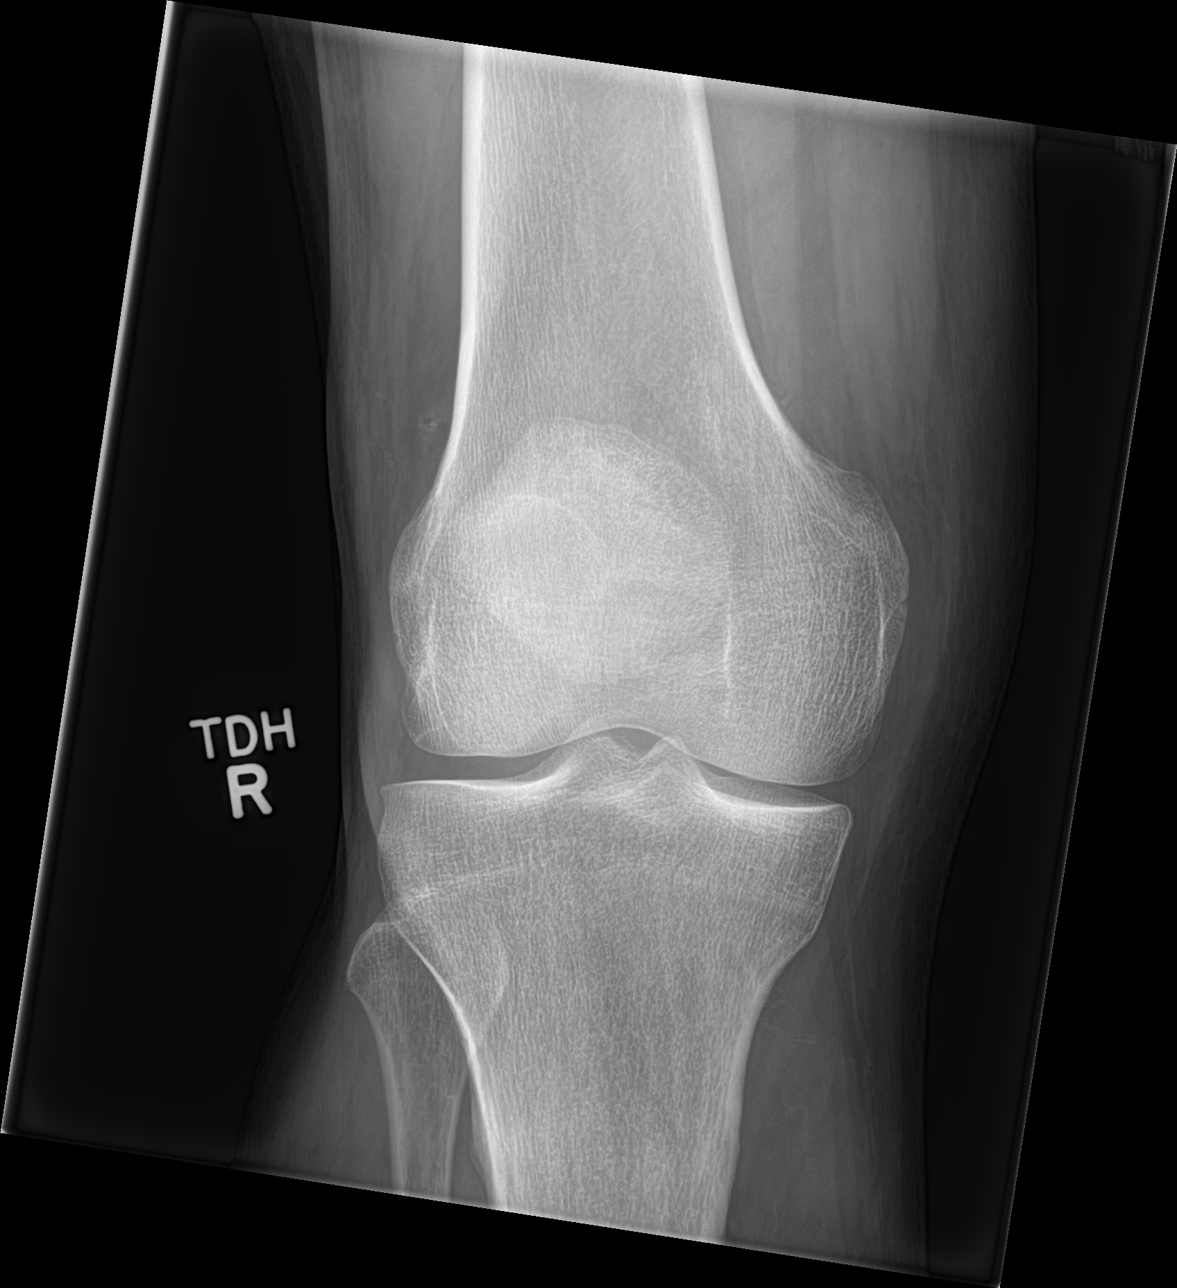

[knee obl (1 of 2)]
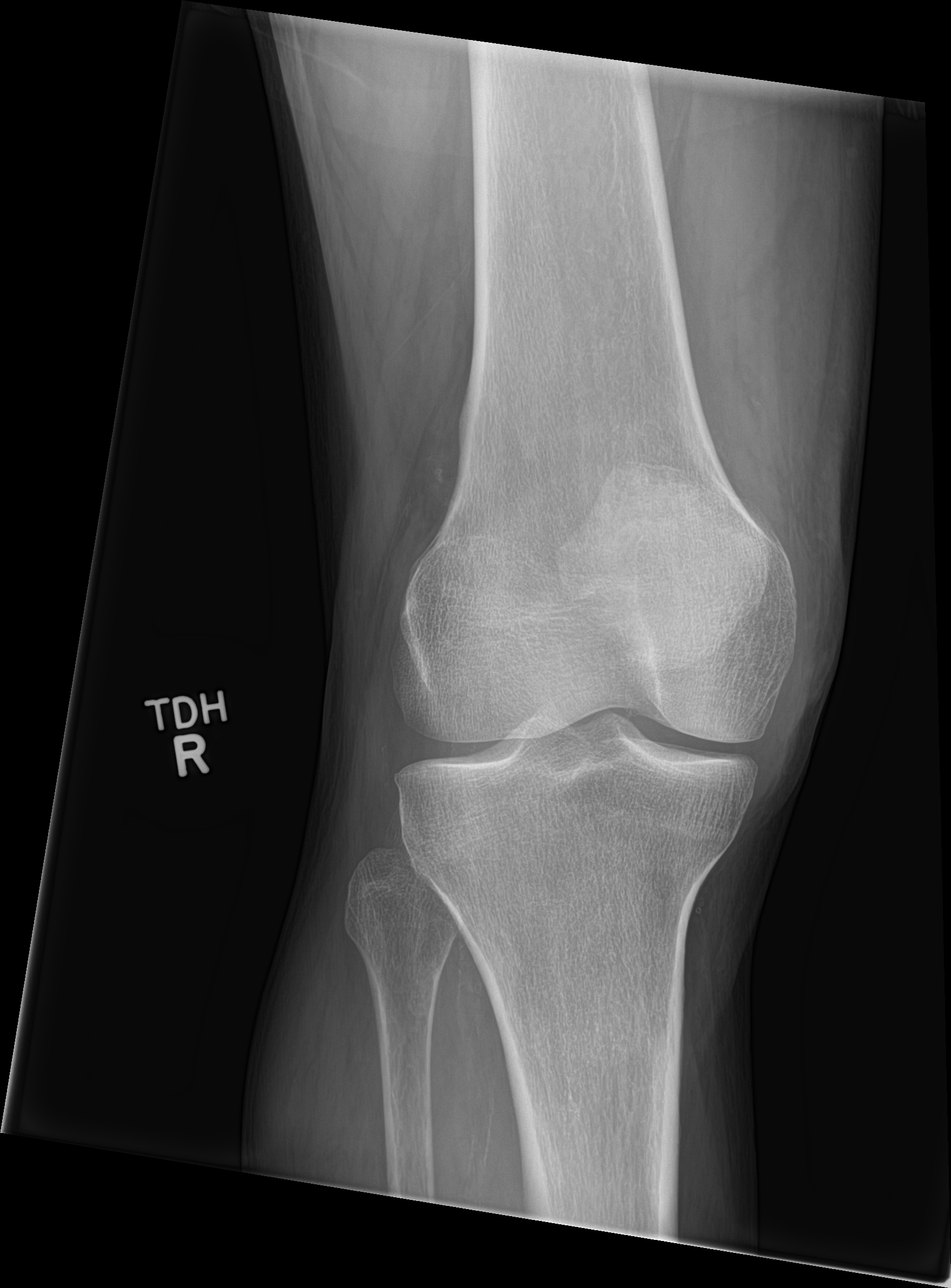

[knee obl (2 of 2)]
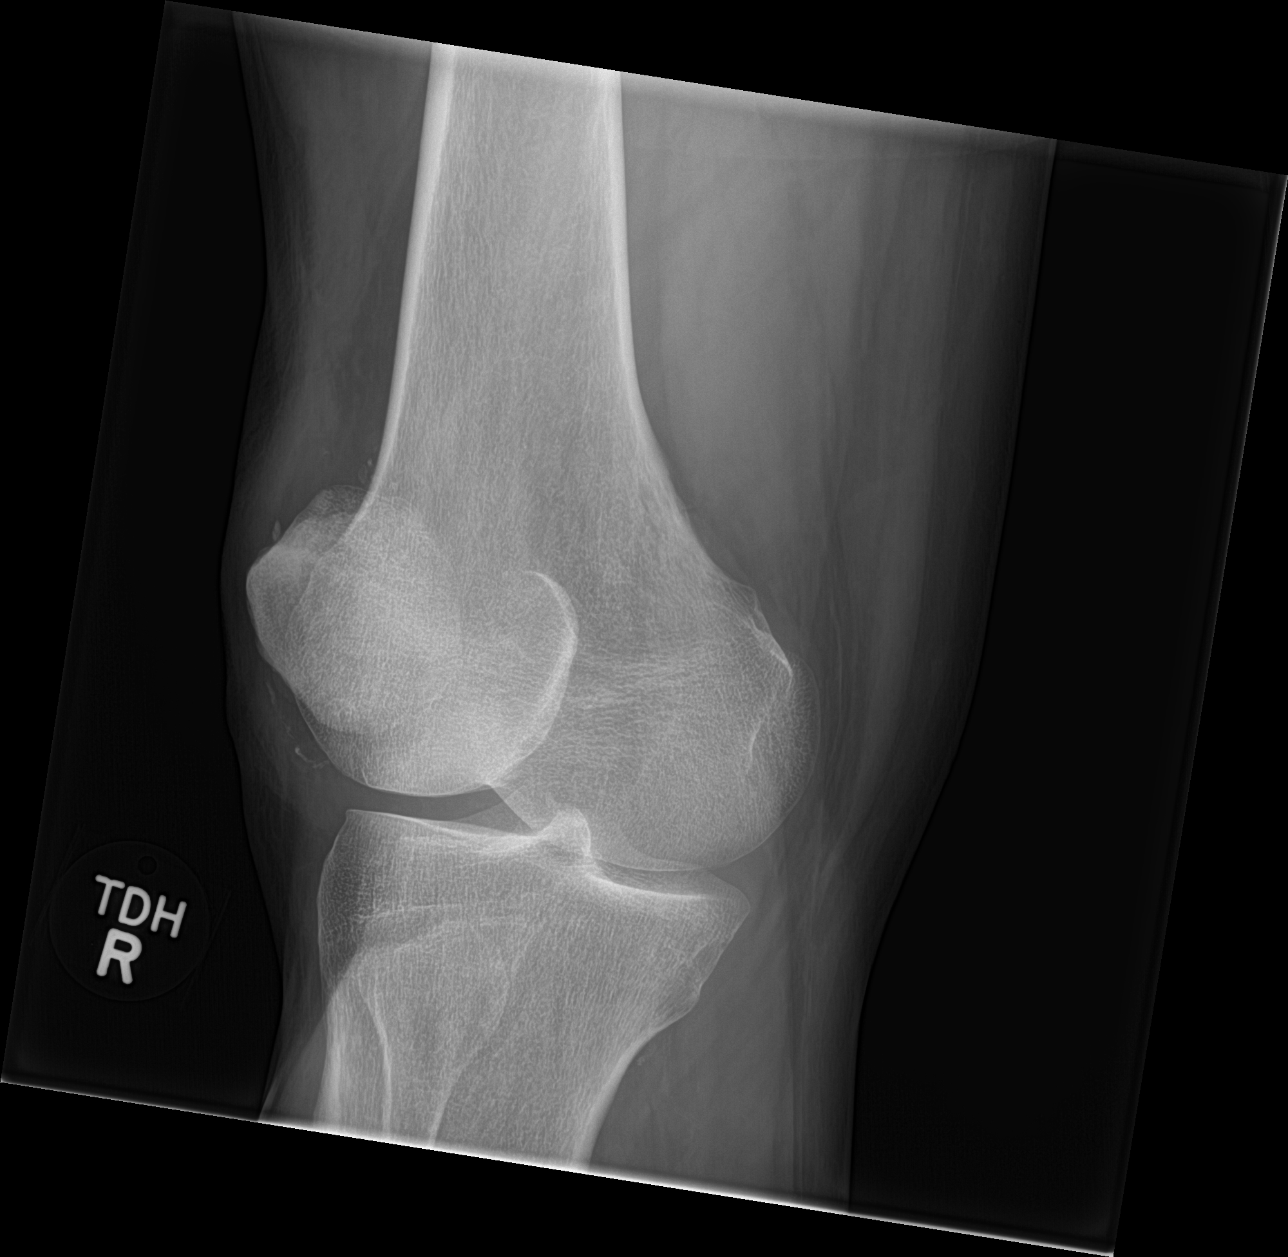

[4 of 4 positions shown; findings below may reference images not displayed]

FINDINGS: There is no evidence of fracture, dislocation, or joint effusion.
There is no evidence of arthropathy or other focal bone abnormality.
Soft tissues are unremarkable.
IMPRESSION: Normal right knee.

## 2019-02-07 IMAGING — MR MR FOOT*R* W/O CM
5 of 7 series · 29 of 40 positions shown · non-contrast
Comparison: None.

CLINICAL DATA: Plantar ulcer at the base of the second toe.
Evaluate for osteomyelitis.

EXAM:
MRI OF THE RIGHT FOREFOOT WITHOUT CONTRAST
TECHNIQUE: Multiplanar, multisequence MR imaging of the right forefoot was
performed. No intravenous contrast was administered.

[Series 4: T1 · coronal · 3.0mm · 0.27mm/px · 8 of 54 slices shown (1 of 2)]
[im 1/54]
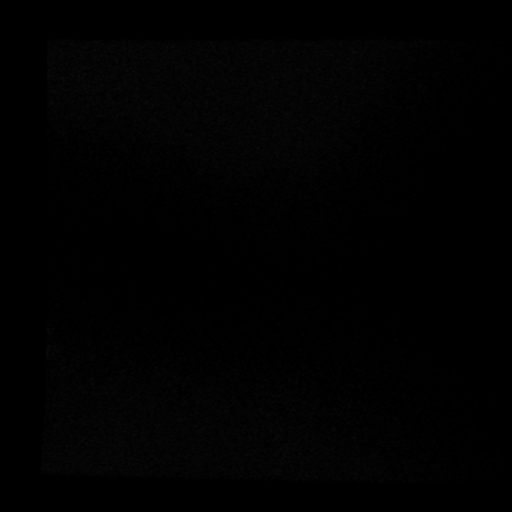
[im 8/54]
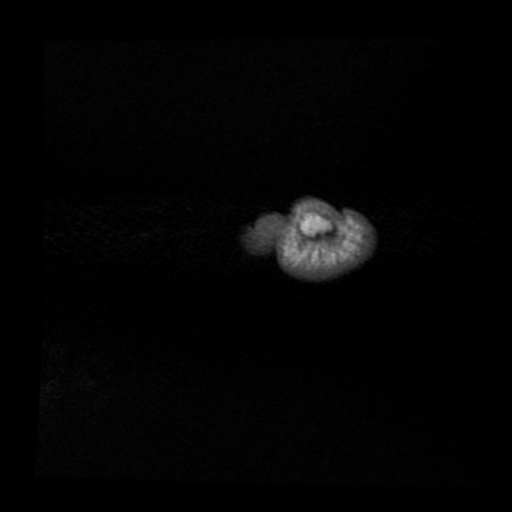
[im 16/54]
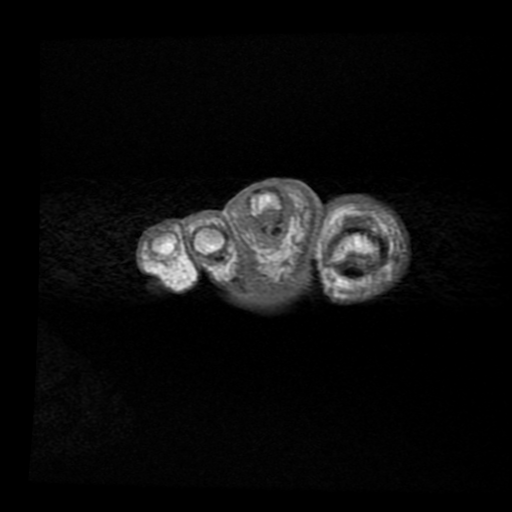
[im 23/54]
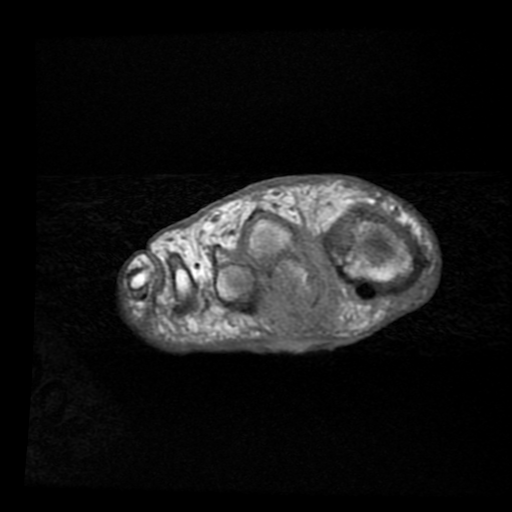
[im 31/54]
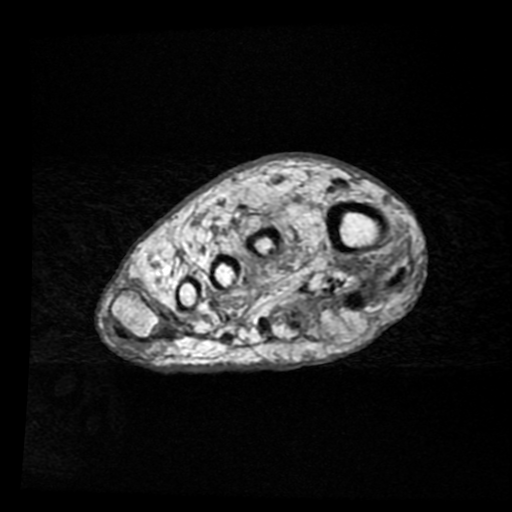
[im 38/54]
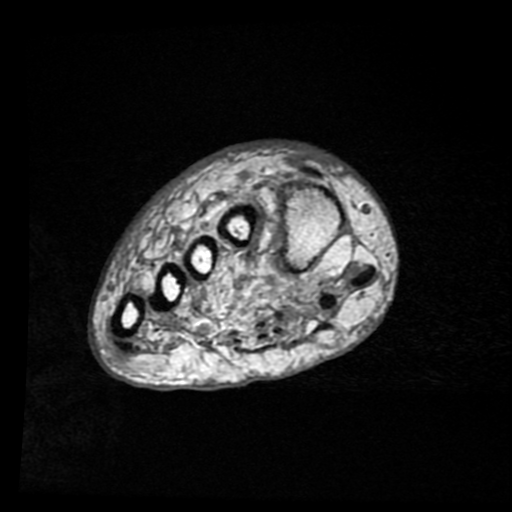
[im 46/54]
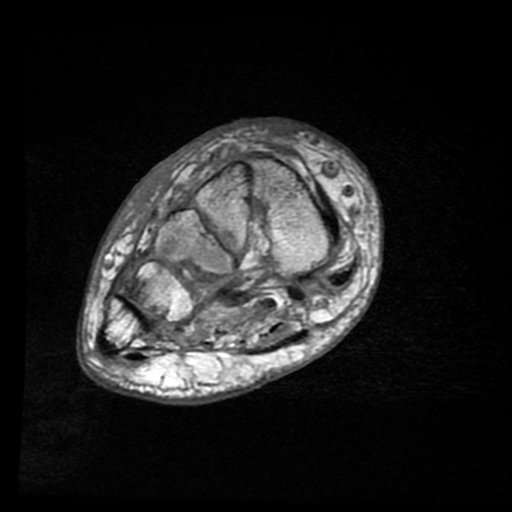
[im 54/54]
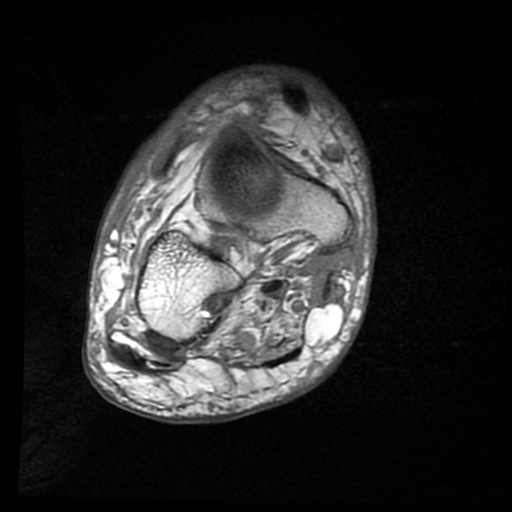

[Series 5: T2 fat-sat · coronal · 3.0mm · 0.55mm/px · 8 of 54 slices shown (1 of 2)]
[im 1/54]
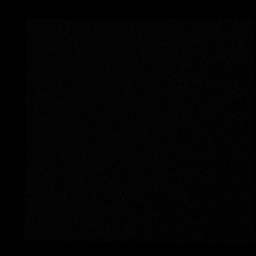
[im 8/54]
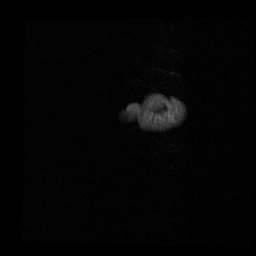
[im 16/54]
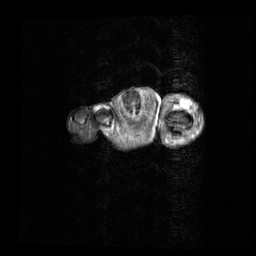
[im 23/54]
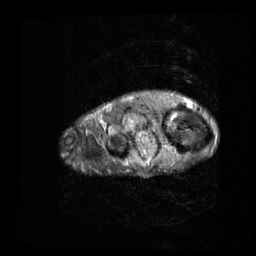
[im 31/54]
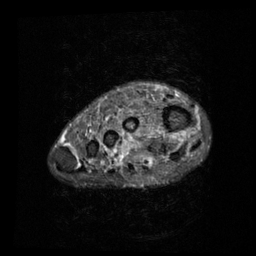
[im 38/54]
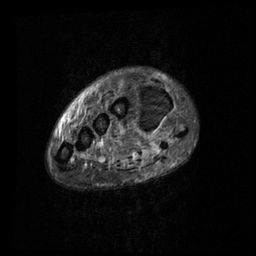
[im 46/54]
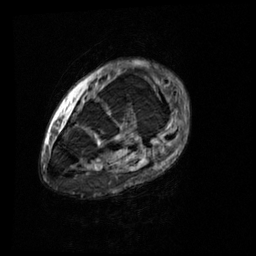
[im 54/54]
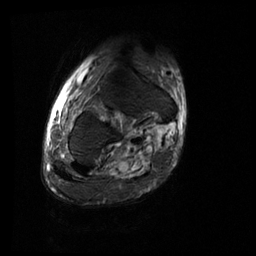

[Series 6: STIR · sagittal · 3.0mm · 0.31mm/px · 2 of 30 slices shown]
[im 1/30]
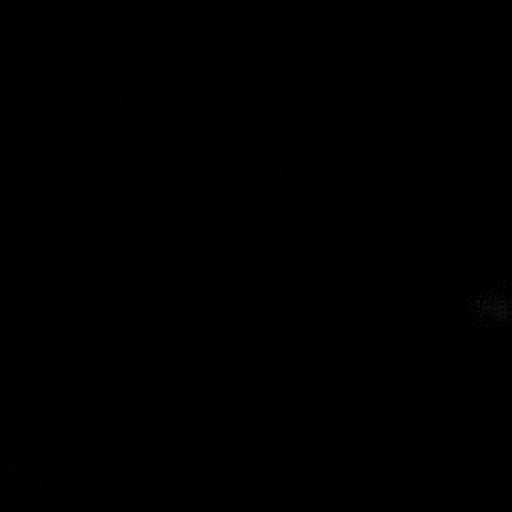
[im 8/30]
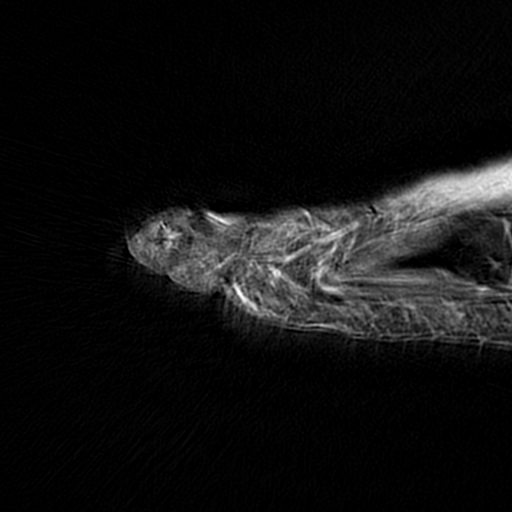

[Series 8: T1 · axial · 3.0mm · 0.31mm/px · z∈[-116,-47]mm · 3 of 22 slices shown (2 of 2)]
[im 1/22]
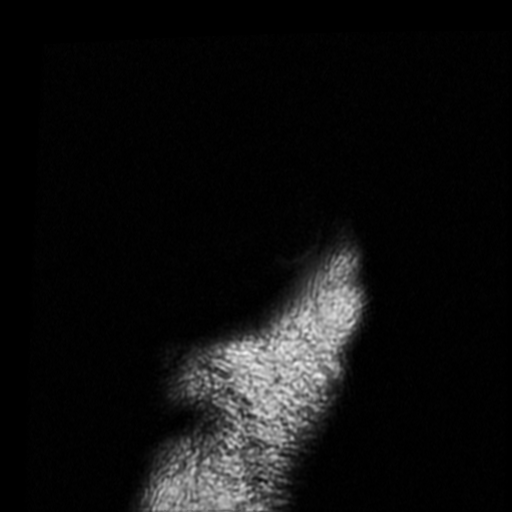
[im 11/22]
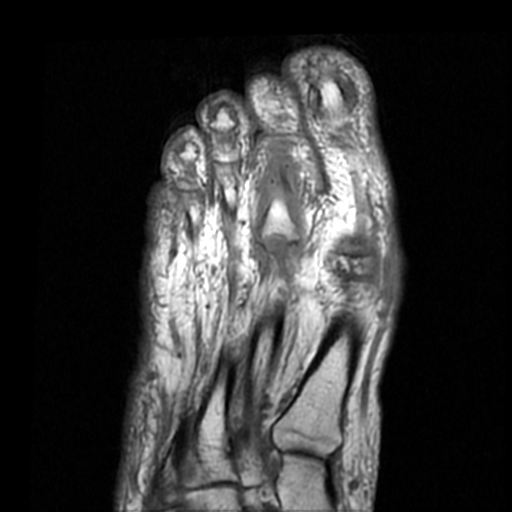
[im 22/22]
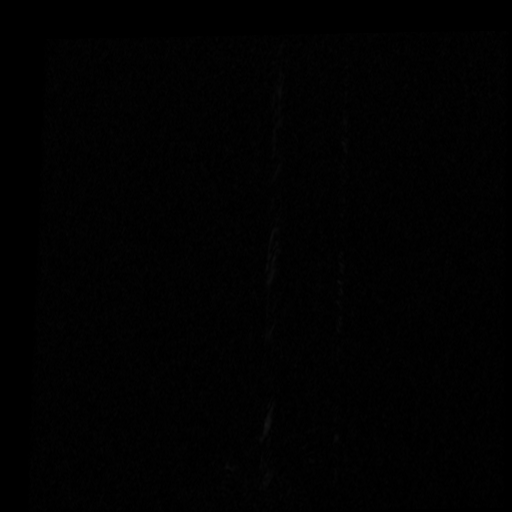

[Series 9: T2 fat-sat · coronal · 3.0mm · 0.55mm/px · 8 of 54 slices shown (2 of 2)]
[im 1/54]
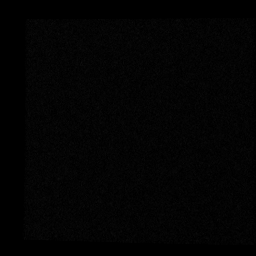
[im 8/54]
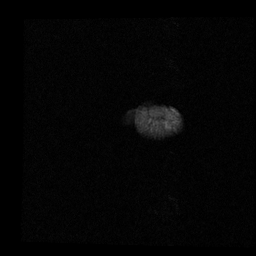
[im 16/54]
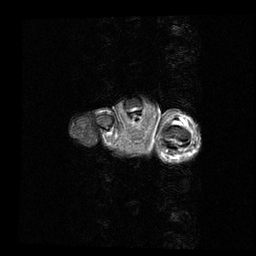
[im 23/54]
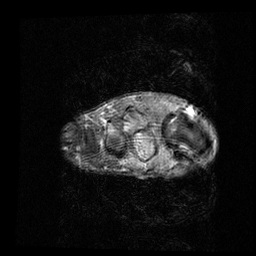
[im 31/54]
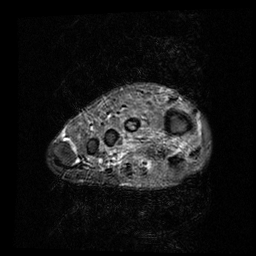
[im 38/54]
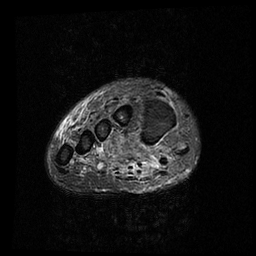
[im 46/54]
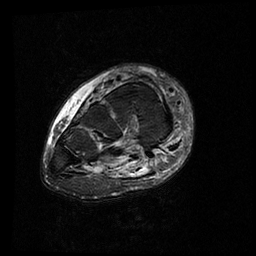
[im 54/54]
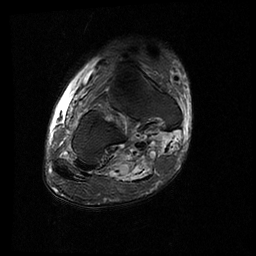

[29 of 40 positions shown; findings below may reference images not displayed]

FINDINGS: Bones/Joint/Cartilage

There is a soft tissue ulcer at the base of the second MTP joint
with increased STIR signal in the second metatarsal head and base of
the second proximal phalanx. There is corresponding T1 hypointensity
in the second metatarsal head. T1 marrow signal of the proximal
phalanx appears preserved. There is dorsal dislocation of the second
proximal phalanx with respect to the second metatarsal. There is a
moderate second MTP joint effusion with a few small foci of gas
surrounding the second MTP joint.

There is faint edema at the base of the third proximal phalanx, with
preserved T1 marrow signal. Degenerative marrow edema of the
tarsometatarsal joints.

Mild hallux valgus deformity.

Ligaments

The second MTP medial collateral ligament still appears intact. The
lateral collateral ligament is not well seen and possibly torn.

Muscles and Tendons

Edema and fatty atrophy of the intrinsic muscles of the forefoot,
consistent with diabetic muscle change. The visualized flexor and
extensor tendons are grossly intact. There is trace fluid
surrounding the second flexor tendons at the level of the mid to
distal metatarsal.

Soft tissues

Mild dorsal forefoot soft tissue swelling.
IMPRESSION: 1. Soft tissue ulcer at the base of the second MTP joint with
associated septic arthritis and osteomyelitis of the second
metatarsal head. Edema in the base of the second proximal phalanx
without discrete T1 marrow signal hypointensity may be reactive,
although early osteomyelitis is not excluded given proximity to the
infection.
2. Dorsal dislocation of the second proximal phalanx with respect to
the second metatarsal.

These results will be called to the ordering clinician or
representative by the Radiologist Assistant, and communication
documented in the PACS or zVision Dashboard.

## 2020-04-30 ENCOUNTER — Encounter (INDEPENDENT_AMBULATORY_CARE_PROVIDER_SITE_OTHER): Payer: Self-pay | Admitting: Ophthalmology

## 2020-05-03 ENCOUNTER — Encounter (INDEPENDENT_AMBULATORY_CARE_PROVIDER_SITE_OTHER): Payer: Self-pay | Admitting: Ophthalmology

## 2020-05-03 ENCOUNTER — Encounter (INDEPENDENT_AMBULATORY_CARE_PROVIDER_SITE_OTHER): Payer: Self-pay

## 2020-05-03 DIAGNOSIS — H3581 Retinal edema: Secondary | ICD-10-CM

## 2020-05-06 NOTE — Progress Notes (Addendum)
[facility] - Clinic Note  05/07/2020     CHIEF COMPLAINT Patient presents for Diabetic Eye Exam   HISTORY OF PRESENT ILLNESS: Joel Mcdonald is a 59 y.o. male who presents to the clinic today for:   HPI    Diabetic Eye Exam    Diabetes characteristics include Type 2.  This started 25 years ago.  Blood sugar level is uncontrolled.  Last A1C 10.  Associated Diagnosis Neuropathy and Kidney Disease.  I, the attending physician,  performed the HPI with the patient and updated documentation appropriately.          Comments    Retina Eval per Dr. Allena KatzPatel-  Pt states Dr. Allena KatzPatel seen bleeding in the back of his eyes.  Pt denies vision problems or noticing any new problems.  BS is normally in the low 200's.  He checked it yesterday but forgot the reading. PCP Dr. Katrinka BlazingSmith just changed some of his medication because he seen a decline in his kidney function.         Last edited by Rennis ChrisZamora, Amarisa Wilinski, MD on 05/07/2020  9:35 AM. (History)    pt is here on the referral of Dr. Allena KatzPatel for DM exam, pts last A1c was 10.3 on 10.07.21, pt states he has been diabetic for several years and takes insulin, pt states he is not having any problems with his vision, pt denies hx of heart attack or stroke  Referring physician: Everardo PacificPatel, Niyati, OD [address] [place] [id number]  HISTORICAL INFORMATION:   Selected notes from the MEDICAL RECORD NUMBER Referred by Dr. Allena KatzPatel for eval of diabetic retinopathy   CURRENT MEDICATIONS: No current outpatient medications on file. (Ophthalmic Drugs)   No current facility-administered medications for this visit. (Ophthalmic Drugs)   Current Outpatient Medications (Other)  Medication Sig  . amLODipine (NORVASC) 10 MG tablet Take 10 mg by mouth daily.  Marland Kitchen. atorvastatin (LIPITOR) 40 MG tablet Take 40 mg by mouth every morning.  Marland Kitchen. glimepiride (AMARYL) 4 MG tablet Take 1 tablet (4 mg total) by mouth 2 (two) times daily.  . insulin glargine  (LANTUS) 100 UNIT/ML injection Inject 0.2 mLs (20 Units total) into the skin at bedtime.  . insulin lispro (HUMALOG) 100 UNIT/ML injection Inject into the skin as needed. <150 - no insulin 151-200 - Take 6 units 201-250 - Take 8 untis 251-300 - Take 10 units 301-350 - Take 12 units 351-400 - Take 14 units >400 - Take 16 units  . metFORMIN (GLUCOPHAGE) 1000 MG tablet Take 0.5 tablets (500 mg total) by mouth daily with breakfast.  . valsartan (DIOVAN) 160 MG tablet Take 160 mg by mouth daily.  Marland Kitchen. BYDUREON 2 MG PEN  (Patient not taking: Reported on 05/07/2020)  . carvedilol (COREG) 12.5 MG tablet  (Patient not taking: Reported on 05/07/2020)  . dicyclomine (BENTYL) 20 MG tablet Take 1 tablet (20 mg total) by mouth 2 (two) times daily. (Patient not taking: Reported on 09/28/2017)  . Exenatide ER (BYDUREON) 2 MG SRER Inject 2 mg into the skin every Sunday.  (Patient not taking: Reported on 05/07/2020)  . losartan-hydrochlorothiazide (HYZAAR) 100-25 MG tablet  (Patient not taking: Reported on 05/07/2020)  . ondansetron (ZOFRAN) 4 MG tablet Take 1 tablet (4 mg total) by mouth every 8 (eight) hours as needed for nausea or vomiting. (Patient not taking: Reported on 09/28/2017)  . ONE TOUCH ULTRA TEST test strip  (Patient not taking: Reported on 05/07/2020)  No current facility-administered medications for this visit. (Other)      REVIEW OF SYSTEMS: ROS    Positive for: Genitourinary, Endocrine, Eyes   Negative for: Constitutional, Gastrointestinal, Neurological, Skin, Musculoskeletal, HENT, Cardiovascular, Respiratory, Psychiatric, Allergic/Imm, Heme/Lymph   Last edited by Joni Reining, COA on 05/07/2020  9:10 AM. (History)       ALLERGIES No Known Allergies  PAST MEDICAL HISTORY Past Medical History:  Diagnosis Date  . Diabetes mellitus without complication (HCC)   . Hypertension    Past Surgical History:  Procedure Laterality Date  . TESTICLE TORSION REDUCTION      FAMILY  HISTORY Family History  Problem Relation Age of Onset  . Stroke Mother   . Hypertension Father   . Diabetes Mellitus II Sister   . Heart attack Neg Hx     SOCIAL HISTORY Social History   Tobacco Use  . Smoking status: Never Smoker  . Smokeless tobacco: Never Used  Substance Use Topics  . Alcohol use: No  . Drug use: No         OPHTHALMIC EXAM:  Base Eye Exam    Visual Acuity (Snellen - Linear)      Right Left   Dist cc 20/20 20/30   Dist ph cc  20/25   Correction: Glasses       Tonometry (Tonopen, 9:22 AM)      Right Left   Pressure 18 19       Pupils      Dark Light Shape React APD   Right 2 1 Round Brisk None   Left 2 1 Round Brisk None       Visual Fields (Counting fingers)      Left Right    Full Full       Extraocular Movement      Right Left    Full Full       Neuro/Psych    Oriented x3: Yes   Mood/Affect: Normal       Dilation    Both eyes: 1.0% Mydriacyl, 2.5% Phenylephrine @ 9:22 AM        Slit Lamp and Fundus Exam    Slit Lamp Exam      Right Left   Lids/Lashes Normal Normal   Conjunctiva/Sclera mild Melanosis mild Melanosis   Cornea arcus, trace Punctate epithelial erosions, trace Debris in tear film arcus, trace Punctate epithelial erosions, trace Debris in tear film   Anterior Chamber Deep and quiet Deep and quiet   Iris Round and moderately dilated Round and moderately dilated   Lens 2+ Nuclear sclerosis, 2+ Cortical cataract 2+ Nuclear sclerosis, 2+ Cortical cataract   Vitreous Vitreous syneresis Vitreous syneresis       Fundus Exam      Right Left   Disc Pink and Sharp, Compact, mild temporal PPA Pink and Sharp, Compact, mild tilt, temporal PPA   C/D Ratio 0.1 0.1   Macula Flat, Good foveal reflex, mild RPE mottling, scattered Microaneurysms/IRH Flat, blunted foveal reflex, scattered Microaneurysms   Vessels attenuated, mild tortuousity, peripheral sclerosis / ghost vessels attenuated, mild tortuousity, peripheral  sclerosis / ghost vessels   Periphery Attached, scattered IRH/DBH Attached, scattered IRH        Refraction    Wearing Rx      Sphere Cylinder Axis   Right -3.25 +0.50 120   Left -4.25 +1.00 075   Type: SVL       Manifest Refraction      Sphere Cylinder Axis Dist VA  Right       Left -4.25 +1.00 055 20/25+1          IMAGING AND PROCEDURES  Imaging and Procedures for 05/07/2020  OCT, Retina - OU - Both Eyes       Right Eye Quality was good. Central Foveal Thickness: 249. Progression has no prior data. Findings include normal foveal contour, no IRF, no SRF, vitreomacular adhesion .   Left Eye Quality was good. Central Foveal Thickness: 256. Progression has no prior data. Findings include normal foveal contour, no IRF, no SRF, vitreomacular adhesion .   Notes *Images captured and stored on drive  Diagnosis / Impression:  NFP, no IRF/SRF OU No DME OU  Clinical management:  See below  Abbreviations: NFP - Normal foveal profile. CME - cystoid macular edema. PED - pigment epithelial detachment. IRF - intraretinal fluid. SRF - subretinal fluid. EZ - ellipsoid zone. ERM - epiretinal membrane. ORA - outer retinal atrophy. ORT - outer retinal tubulation. SRHM - subretinal hyper-reflective material. IRHM - intraretinal hyper-reflective material        Fluorescein Angiography Optos (Transit OD)       Right Eye   Early phase findings include vascular perfusion defect, microaneurysm. Mid/Late phase findings include vascular perfusion defect, microaneurysm, leakage (No NV).   Left Eye   Early phase findings include microaneurysm, vascular perfusion defect. Mid/Late phase findings include microaneurysm, vascular perfusion defect, leakage (No NV).   Notes **Images stored on drive**  Impression: Moderate NPDR OU Large patches of peripheral capillary drop out OU (OS> OD) No NV OU Late-leaking MA OU                ASSESSMENT/PLAN:    ICD-10-CM   1.  Moderate nonproliferative diabetic retinopathy of both eyes without macular edema associated with type 2 diabetes mellitus (HCC)  G25.4270   2. Retinal ischemia  H35.82   3. Retinal edema  H35.81 OCT, Retina - OU - Both Eyes  4. Essential hypertension  I10   5. Hypertensive retinopathy of both eyes  H35.033 Fluorescein Angiography Optos (Transit OD)  6. Combined forms of age-related cataract of both eyes  H25.813     1-3. Moderate nonproliferative diabetic retinopathy w/o DME, but with peripheral retinal ischemia OU - The incidence, risk factors for progression, natural history and treatment options for diabetic retinopathy were discussed with patient.   - The need for close monitoring of blood glucose, blood pressure, and serum lipids, avoiding cigarette or any type of tobacco, and the need for long term follow up was also discussed with patient. - exam shows peripheral sclerosis of vessels OU -- peripheral ghost vessels - FA 10.19.21 shows patches of peripheral nonperfusion OU (OS > OD); no NV OU - OCT without diabetic macular edema, OU - f/u in 4 weeks -- DFE/OCT  4,5. Hypertensive retinopathy OU - discussed importance of tight BP control - monitor  6. Mixed Cataract OU - The symptoms of cataract, surgical options, and treatments and risks were discussed with patient. - discussed diagnosis and progression - not yet visually significant - monitor for now   Ophthalmic Meds Ordered this visit:  No orders of the defined types were placed in this encounter.      Return in about 4 weeks (around 06/04/2020) for f/u 4 weeks, NPDR OU, DFE, OCT.  There are no Patient Instructions on file for this visit.   Explained the diagnoses, plan, and follow up with the patient and they expressed understanding.  Patient expressed understanding  of the importance of proper follow up care.   This document serves as a record of services personally performed by Karie Chimera, MD, PhD. It was  created on their behalf by Cristopher Estimable, COT an ophthalmic technician. The creation of this record is the provider's dictation and/or activities during the visit.    Electronically signed by: Cristopher Estimable, COT 10.18.21 @ 12:01 PM   This document serves as a record of services personally performed by Karie Chimera, MD, PhD. It was created on their behalf by Glee Arvin. Manson Passey, OA an ophthalmic technician. The creation of this record is the provider's dictation and/or activities during the visit.    Electronically signed by: Glee Arvin. Manson Passey, [place] 10.19.2021 12:01 PM   Karie Chimera, M.D., Ph.D. Diseases & Surgery of the Retina and Vitreous [facility]  I have reviewed the above documentation for accuracy and completeness, and I agree with the above. Karie Chimera, M.D., Ph.D. 05/07/20 12:01 PM     Abbreviations: M myopia (nearsighted); A astigmatism; H hyperopia (farsighted); P presbyopia; Mrx spectacle prescription;  CTL contact lenses; OD right eye; OS left eye; OU both eyes  XT exotropia; ET esotropia; PEK punctate epithelial keratitis; PEE punctate epithelial erosions; DES dry eye syndrome; MGD meibomian gland dysfunction; ATs artificial tears; PFAT's preservative free artificial tears; NSC nuclear sclerotic cataract; PSC posterior subcapsular cataract; ERM epi-retinal membrane; PVD posterior vitreous detachment; RD retinal detachment; DM diabetes mellitus; DR diabetic retinopathy; NPDR non-proliferative diabetic retinopathy; PDR proliferative diabetic retinopathy; CSME clinically significant macular edema; DME diabetic macular edema; dbh dot blot hemorrhages; CWS cotton wool spot; POAG primary open angle glaucoma; C/D cup-to-disc ratio; HVF humphrey visual field; GVF goldmann visual field; OCT optical coherence tomography; IOP intraocular pressure; BRVO Branch retinal vein occlusion; CRVO central retinal vein occlusion; CRAO central retinal artery occlusion; BRAO  branch retinal artery occlusion; RT retinal tear; SB scleral buckle; PPV pars plana vitrectomy; VH Vitreous hemorrhage; PRP panretinal laser photocoagulation; IVK intravitreal kenalog; VMT vitreomacular traction; MH Macular hole;  NVD neovascularization of the disc; NVE neovascularization elsewhere; AREDS age related eye disease study; ARMD age related macular degeneration; POAG primary open angle glaucoma; EBMD epithelial/anterior basement membrane dystrophy; ACIOL anterior chamber intraocular lens; IOL intraocular lens; PCIOL posterior chamber intraocular lens; Phaco/IOL phacoemulsification with intraocular lens placement; PRK photorefractive keratectomy; LASIK laser assisted in situ keratomileusis; HTN hypertension; DM diabetes mellitus; COPD chronic obstructive pulmonary disease

## 2020-05-07 ENCOUNTER — Encounter (INDEPENDENT_AMBULATORY_CARE_PROVIDER_SITE_OTHER): Payer: Self-pay | Admitting: Ophthalmology

## 2020-05-07 ENCOUNTER — Other Ambulatory Visit: Payer: Self-pay

## 2020-05-07 ENCOUNTER — Ambulatory Visit (INDEPENDENT_AMBULATORY_CARE_PROVIDER_SITE_OTHER): Payer: BC Managed Care – PPO | Admitting: Ophthalmology

## 2020-05-07 DIAGNOSIS — H3581 Retinal edema: Secondary | ICD-10-CM

## 2020-05-07 DIAGNOSIS — H3582 Retinal ischemia: Secondary | ICD-10-CM | POA: Diagnosis not present

## 2020-05-07 DIAGNOSIS — H35033 Hypertensive retinopathy, bilateral: Secondary | ICD-10-CM

## 2020-05-07 DIAGNOSIS — I10 Essential (primary) hypertension: Secondary | ICD-10-CM | POA: Diagnosis not present

## 2020-05-07 DIAGNOSIS — H25813 Combined forms of age-related cataract, bilateral: Secondary | ICD-10-CM

## 2020-05-07 DIAGNOSIS — E113393 Type 2 diabetes mellitus with moderate nonproliferative diabetic retinopathy without macular edema, bilateral: Secondary | ICD-10-CM | POA: Diagnosis not present

## 2020-06-05 ENCOUNTER — Encounter (INDEPENDENT_AMBULATORY_CARE_PROVIDER_SITE_OTHER): Payer: BC Managed Care – PPO | Admitting: Ophthalmology

## 2020-06-08 ENCOUNTER — Other Ambulatory Visit: Payer: Self-pay | Admitting: Infectious Diseases

## 2020-06-08 ENCOUNTER — Ambulatory Visit (HOSPITAL_COMMUNITY)
Admission: RE | Admit: 2020-06-08 | Discharge: 2020-06-08 | Disposition: A | Discharge status: Home / Self Care | Payer: BC Managed Care – PPO | Source: Ambulatory Visit | Attending: Pulmonary Disease | Admitting: Pulmonary Disease

## 2020-06-08 ENCOUNTER — Telehealth: Payer: Self-pay | Admitting: Unknown Physician Specialty

## 2020-06-08 DIAGNOSIS — N183 Chronic kidney disease, stage 3 unspecified: Secondary | ICD-10-CM | POA: Diagnosis not present

## 2020-06-08 DIAGNOSIS — U071 COVID-19: Secondary | ICD-10-CM

## 2020-06-08 DIAGNOSIS — E1122 Type 2 diabetes mellitus with diabetic chronic kidney disease: Secondary | ICD-10-CM

## 2020-06-08 DIAGNOSIS — Z794 Long term (current) use of insulin: Secondary | ICD-10-CM | POA: Insufficient documentation

## 2020-06-08 DIAGNOSIS — E1165 Type 2 diabetes mellitus with hyperglycemia: Secondary | ICD-10-CM | POA: Insufficient documentation

## 2020-06-08 DIAGNOSIS — IMO0002 Reserved for concepts with insufficient information to code with codable children: Secondary | ICD-10-CM

## 2020-06-08 MED ORDER — METHYLPREDNISOLONE SODIUM SUCC 125 MG IJ SOLR: 125.0000 mg | Freq: Once | INTRAMUSCULAR | Status: DC | PRN

## 2020-06-08 MED ORDER — EPINEPHRINE 0.3 MG/0.3ML IJ SOAJ: 0.3000 mg | Freq: Once | INTRAMUSCULAR | Status: DC | PRN

## 2020-06-08 MED ORDER — DIPHENHYDRAMINE HCL 50 MG/ML IJ SOLN: 50.0000 mg | Freq: Once | INTRAMUSCULAR | Status: DC | PRN

## 2020-06-08 MED ORDER — SOTROVIMAB 500 MG/8ML IV SOLN
500.0000 mg | Freq: Once | INTRAVENOUS | Status: AC
  Administered 2020-06-08: 500 mg via INTRAVENOUS

## 2020-06-08 MED ORDER — ALBUTEROL SULFATE HFA 108 (90 BASE) MCG/ACT IN AERS: 2.0000 | INHALATION_SPRAY | Freq: Once | RESPIRATORY_TRACT | Status: DC | PRN

## 2020-06-08 MED ORDER — SODIUM CHLORIDE 0.9 % IV SOLN: INTRAVENOUS | Status: DC | PRN

## 2020-06-08 MED ORDER — FAMOTIDINE IN NACL 20-0.9 MG/50ML-% IV SOLN: 20.0000 mg | Freq: Once | INTRAVENOUS | Status: DC | PRN

## 2020-06-08 MED ORDER — SODIUM CHLORIDE 0.9 % IV BOLUS
1000.0000 mL | Freq: Once | INTRAVENOUS | Status: AC
  Administered 2020-06-08: 1000 mL via INTRAVENOUS

## 2020-06-08 NOTE — Telephone Encounter (Signed)
Spoke with Mr. Saric  He has been sick about a week now   He is just not getting any better now. Today is the first day he has finally been able to eat something. He has no strength or energy.   The following was discussed with him today:   You have been scheduled to receive the monoclonal antibody therapy at [facility]:   If you have been tested outside of a [facility] Facility - you MUST bring a copy of your positive test with you the morning of your appointment. You may take a photo of this and upload to your MyChart portal or have the testing facility fax the result to [phone]    The address for the infusion clinic site is:  --GPS address is [address] - the parking is located near [employer] building where you will see  COVID19 Infusion feather banner marking the entrance to parking.   (see photos below)            --Enter into the 2nd entrance where the "wave, flag banner" is at the road. Turn into this 2nd entrance and immediately turn left to park in 1 of the 5 parking spots.   --Please stay in your car and call the desk for assistance inside [phone].   --Average time in department is roughly 90 minutes to 2 hours for monoclonal treatment - this includes preparation of the medication, IV start and the required 1 hour monitoring after the infusion.    Should you develop worsening shortness of breath, chest pain or severe breathing problems please do not wait for this appointment and go to the Emergency room for evaluation and treatment. You will undergo another oxygen screen before your infusion to ensure this is the best treatment option for you. There is a chance that the best decision may be to send you to the Emergency Room for evaluation at the time of your appointment.   The day of your visit you should: Marland Kitchen Get plenty of rest the night before and drink plenty of water . Eat a light meal/snack before coming and take your medications as prescribed    . Wear warm, comfortable clothes with a shirt that can roll-up over the elbow (will need IV start).  . Wear a mask  . Consider bringing some activity to help pass the time   The medication itself is free, paid for by government COVID funding relief, and many commercial insurers are waiving bills related to COVID treatment, however some have sent bills to patients later for the administration fees related to the medication.  Please contact your insurance agent to discuss prior to your appointment if you would like further details about billing specific to your policy.    Billing codes are: O0370 or [id number] or [phone]   I hope this helps find you feeling better,  Rexene Alberts

## 2020-06-08 NOTE — Progress Notes (Signed)
Diagnosis: COVID-19  Physician: Dr. Patrick Wright  Procedure: Covid Infusion Clinic Med: Sotrovimab infusion - Provided patient with sotrovimab fact sheet for patients, parents, and caregivers prior to infusion. Patient reviewed Fact Sheet for Patients, Parents, and Caregivers for Emergency Use Authorization (EUA) of Sotrovimab for the Treatment of Coronavirus. Patient also reviewed and is agreeable to the estimated cost of treatment. Patient is agreeable to proceed.    Complications: No immediate complications noted  Discharge: Discharged home  If after the infusion you have any questions or concerns please call the Advanced Practice Provider at [phone]   Ailie Gage R Thaila Bottoms 06/08/2020   

## 2020-06-08 NOTE — Discharge Instructions (Signed)

## 2020-06-08 NOTE — Telephone Encounter (Signed)
Called to Discuss with patient about Covid symptoms and the use of the monoclonal antibody infusion for those with mild to moderate Covid symptoms and at a high risk of hospitalization.     Pt appears to qualify for this infusion due to co-morbid conditions and/or a member of an at-risk group in accordance with the FDA Emergency Use Authorization.    Unable to reach pt   LMOM 

## 2020-06-08 NOTE — Progress Notes (Signed)
I connected by phone with Joel Mcdonald on 06/08/2020 at 1:09 PM to discuss the potential use of a new treatment for mild to moderate COVID-19 viral infection in non-hospitalized patients.  This patient is a 59 y.o. male that meets the FDA criteria for Emergency Use Authorization of COVID monoclonal antibody casirivimab/imdevimab, bamlanivimab/eteseviamb, or sotrovimab.  Has a (+) direct SARS-CoV-2 viral test result  Has mild or moderate COVID-19   Is NOT hospitalized due to COVID-19  Is within 10 days of symptom onset  Has at least one of the high risk factor(s) for progression to severe COVID-19 and/or hospitalization as defined in EUA.  Specific high risk criteria : BMI > 25 and Diabetes   I have spoken and communicated the following to the patient or parent/caregiver regarding COVID monoclonal antibody treatment:  1. FDA has authorized the emergency use for the treatment of mild to moderate COVID-19 in adults and pediatric patients with positive results of direct SARS-CoV-2 viral testing who are 34 years of age and older weighing at least 40 kg, and who are at high risk for progressing to severe COVID-19 and/or hospitalization.  2. The significant known and potential risks and benefits of COVID monoclonal antibody, and the extent to which such potential risks and benefits are unknown.  3. Information on available alternative treatments and the risks and benefits of those alternatives, including clinical trials.  4. Patients treated with COVID monoclonal antibody should continue to self-isolate and use infection control measures (e.g., wear mask, isolate, social distance, avoid sharing personal items, clean and disinfect "high touch" surfaces, and frequent handwashing) according to CDC guidelines.   5. The patient or parent/caregiver has the option to accept or refuse COVID monoclonal antibody treatment.  After reviewing this information with the patient, the patient has agreed to  receive one of the available covid 19 monoclonal antibodies and will be provided an appropriate fact sheet prior to infusion. Rexene Alberts, NP 06/08/2020 1:09 PM

## 2020-06-10 ENCOUNTER — Other Ambulatory Visit (HOSPITAL_COMMUNITY): Payer: Self-pay

## 2020-06-19 DEATH — deceased
